# Patient Record
Sex: Male | Born: 2000 | Race: White | Hispanic: No | Marital: Single | Smoking: Former smoker
Health system: Southern US, Community
[De-identification: ages and names within clinical notes are randomized; demographics above are authoritative.]

## PROBLEM LIST (undated history)

## (undated) DIAGNOSIS — R12 Heartburn: Secondary | ICD-10-CM

## (undated) DIAGNOSIS — Z91030 Bee allergy status: Secondary | ICD-10-CM

## (undated) HISTORY — PX: NO PAST SURGERIES: SHX2092

---
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
## 2020-02-10 ENCOUNTER — Other Ambulatory Visit: Payer: Self-pay

## 2020-02-10 ENCOUNTER — Encounter: Payer: Self-pay | Admitting: Emergency Medicine

## 2020-02-10 ENCOUNTER — Ambulatory Visit
Admission: EM | Admit: 2020-02-10 | Discharge: 2020-02-10 | Disposition: A | Discharge status: Home / Self Care | Payer: Medicaid Other | Attending: Internal Medicine | Admitting: Internal Medicine

## 2020-02-10 DIAGNOSIS — T148XXA Other injury of unspecified body region, initial encounter: Secondary | ICD-10-CM | POA: Diagnosis not present

## 2020-02-10 DIAGNOSIS — M62830 Muscle spasm of back: Secondary | ICD-10-CM

## 2020-02-10 MED ORDER — METAXALONE 800 MG PO TABS: 800.0000 mg | ORAL_TABLET | Freq: Three times a day (TID) | ORAL | 0 refills | Status: DC

## 2020-02-10 NOTE — Discharge Instructions (Addendum)
Ice area of pain for 15-20 minutes  3 times a day for 48h, then alternate with heat for the duration time and time Do not take the muscle relaxer if it makes you sleepy, and take it ones you are home  Follow up with your family Dr if it persists, you may need to go see physical therapy.   Take Ibuprofen 600 mg three times a day for 5-7 days for pain and inflammation.

## 2020-02-10 NOTE — ED Provider Notes (Signed)
[facility]    CSN: [id number] Arrival date & time: 02/10/20  1418      History   Chief Complaint Chief Complaint  Patient presents with  . Back Pain    HPI Jeffery [name] is a 19 y.o. male who presents with mid thoracic pain radiating to his shoulder blades which occurred while at work  This am around 10:30 am when he was lifting about 30 lb of wood into a small cart and felt a pulling sensation on this area. He left work early.  He does not want to turn this visit into worker's comp case. He reported to his boss who told him to come to the Dr.'s note for leaving work today. He applied heat when he got home and helped a little til he started moving.  He denies injuring this area before.  This pain is described as sharp Provoked with thoracic movement.   Past Medical History:  Diagnosis Date  . Heart burn     There are no problems to display for this patient.   Past Surgical History:  Procedure Laterality Date  . NO PAST SURGERIES       Home Medications    Prior to Admission medications   Medication Sig Start Date End Date Taking? Authorizing Provider  EPINEPHrine (EPIPEN 2-PAK) 0.3 mg/0.3 mL IJ SOAJ injection Inject 0.3 mLs (0.3 mg total) into the muscle once. 03/28/15  Yes Jolene Provost, MD  metaxalone (SKELAXIN) 800 MG tablet Take 1 tablet (800 mg total) by mouth 3 (three) times daily. Prn muscle spasm 02/10/20   Rodriguez-Southworth, Nettie Elm, PA-C    Family History Family History  Problem Relation Age of Onset  . Thyroid disease Mother     Social History Social History   Tobacco Use  . Smoking status: Former Games developer  . Smokeless tobacco: Never Used  Vaping Use  . Vaping Use: Every day  Substance Use Topics  . Alcohol use: No  . Drug use: No     Allergies   Patient has no known allergies.   Review of Systems Review of Systems  Constitutional: Negative for activity change, appetite change and fever.  Respiratory: Negative for cough,  chest tightness and shortness of breath.   Cardiovascular: Negative for chest pain.  Gastrointestinal: Negative for abdominal pain.  Musculoskeletal: Positive for back pain.  Skin: Negative for rash and wound.  Neurological: Negative for numbness.   Physical Exam Triage Vital Signs ED Triage Vitals  Enc Vitals Group     BP 02/10/20 1438 125/73     Pulse Rate 02/10/20 1438 61     Resp 02/10/20 1438 18     Temp 02/10/20 1438 98.6 F (37 C)     Temp Source 02/10/20 1438 Oral     SpO2 02/10/20 1438 100 %     Weight 02/10/20 1435 132 lb 15 oz (60.3 kg)     Height 02/10/20 1435 6' (1.829 m)     Head Circumference --      Peak Flow --      Pain Score 02/10/20 1435 7     Pain Loc --      Pain Edu? --      Excl. in GC? --    No data found.  Updated Vital Signs BP 125/73 (BP Location: Left Arm)   Pulse 61   Temp 98.6 F (37 C) (Oral)   Resp 18   Ht 6' (1.829 m)   Wt 132 lb 15 oz (60.3 kg)  SpO2 100%   BMI 18.03 kg/m   Visual Acuity Right Eye Distance:   Left Eye Distance:   Bilateral Distance:    Right Eye Near:   Left Eye Near:    Bilateral Near:     Physical Exam Vitals and nursing note reviewed.  Constitutional:      General: He is not in acute distress.    Appearance: He is normal weight. He is not toxic-appearing.  HENT:     Head: Normocephalic.     Right Ear: External ear normal.     Left Ear: External ear normal.  Eyes:     General: No scleral icterus.    Conjunctiva/sclera: Conjunctivae normal.  Cardiovascular:     Rate and Rhythm: Normal rate and regular rhythm.     Heart sounds: No murmur heard.   Pulmonary:     Effort: Pulmonary effort is normal.     Breath sounds: Normal breath sounds.  Musculoskeletal:        General: Normal range of motion.     Cervical back: Neck supple.     Comments: BACK- has local tenderness on L mid thoracic muscle region which has spasms. Thoracic rotation and L lateral and anterior  flexion provoked his pain   Skin:    General: Skin is warm and dry.  Neurological:     Mental Status: He is alert and oriented to person, place, and time.     Motor: No weakness.     Gait: Gait normal.     Deep Tendon Reflexes: Reflexes normal.  Psychiatric:        Mood and Affect: Mood normal.        Behavior: Behavior normal.        Thought Content: Thought content normal.        Judgment: Judgment normal.     UC Treatments / Results  Labs (all labs ordered are listed, but only abnormal results are displayed) Labs Reviewed - No data to display  EKG   Radiology No results found.  Procedures Procedures (including critical care time)  Medications Ordered in UC Medications - No data to display  Initial Impression / Assessment and Plan / UC Course  I have reviewed the triage vital signs and the nursing notes. Has L thoracic strain. I prescribed him Skelaxin as noted.  See instructions.   Final Clinical Impressions(s) / UC Diagnoses   Final diagnoses:  Muscle strain  Muscle spasm of back     Discharge Instructions     Ice area of pain for 15-20 minutes  3 times a day for 48h, then alternate with heat for the duration time and time Do not take the muscle relaxer if it makes you sleepy, and take it ones you are home  Follow up with your family Dr if it persists, you may need to go see physical therapy.   Take Ibuprofen 600 mg three times a day for 5-7 days for pain and inflammation.     ED Prescriptions    Medication Sig Dispense Auth. Provider   metaxalone (SKELAXIN) 800 MG tablet Take 1 tablet (800 mg total) by mouth 3 (three) times daily. Prn muscle spasm 30 tablet Rodriguez-Southworth, Nettie Elm, PA-C     PDMP not reviewed this encounter.   Garey Ham, PA-C 02/10/20 1552

## 2020-02-10 NOTE — ED Triage Notes (Signed)
Pt c/o back pain. He states it is located from the bottom of his rub cage to the shoulder blades. He states he felt it pull while lifting something at work. 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 He states every time he moves the area aches.

## 2020-02-11 ENCOUNTER — Telehealth: Payer: Self-pay | Admitting: Emergency Medicine

## 2020-02-11 MED ORDER — METHOCARBAMOL 500 MG PO TABS: 500.0000 mg | ORAL_TABLET | Freq: Two times a day (BID) | ORAL | 0 refills | Status: DC | PRN

## 2020-02-11 NOTE — Telephone Encounter (Signed)
Patient was seen yesterday in this facility.  Metaxalone was ordered but was denied by insurance.  Metaxalone order DC; as needed methocarbamol ordered.

## 2020-02-13 ENCOUNTER — Other Ambulatory Visit: Payer: Self-pay

## 2020-02-13 ENCOUNTER — Ambulatory Visit
Admission: EM | Admit: 2020-02-13 | Discharge: 2020-02-13 | Disposition: A | Discharge status: Home / Self Care | Payer: Medicaid Other | Attending: Emergency Medicine | Admitting: Emergency Medicine

## 2020-02-13 DIAGNOSIS — M546 Pain in thoracic spine: Secondary | ICD-10-CM | POA: Diagnosis not present

## 2020-02-13 DIAGNOSIS — Z202 Contact with and (suspected) exposure to infections with a predominantly sexual mode of transmission: Secondary | ICD-10-CM

## 2020-02-13 MED ORDER — TIZANIDINE HCL 4 MG PO TABS: 4.0000 mg | ORAL_TABLET | Freq: Four times a day (QID) | ORAL | 0 refills | Status: DC | PRN

## 2020-02-13 MED ORDER — KETOROLAC TROMETHAMINE 60 MG/2ML IM SOLN
60.0000 mg | Freq: Once | INTRAMUSCULAR | Status: AC
  Administered 2020-02-13: 60 mg via INTRAMUSCULAR

## 2020-02-13 MED ORDER — KETOROLAC TROMETHAMINE 10 MG PO TABS: 10.0000 mg | ORAL_TABLET | Freq: Four times a day (QID) | ORAL | 0 refills | Status: DC | PRN

## 2020-02-13 MED ORDER — DOXYCYCLINE HYCLATE 100 MG PO CAPS: 100.0000 mg | ORAL_CAPSULE | Freq: Two times a day (BID) | ORAL | 0 refills | Status: AC

## 2020-02-13 NOTE — ED Provider Notes (Signed)
[facility]    CSN: [id number] Arrival date & time: 02/13/20  1440      History   Chief Complaint Chief Complaint  Patient presents with  . Back Pain  . Exposure to STD    HPI Jeffery Uddin is a 19 y.o. male.   19 yo male here for re-evaluation of back pain and to obtain treatment for chlamydia.      Past Medical History:  Diagnosis Date  . Heart burn     There are no problems to display for this patient.   Past Surgical History:  Procedure Laterality Date  . NO PAST SURGERIES         Home Medications    Prior to Admission medications   Medication Sig Start Date End Date Taking? Authorizing Provider  EPINEPHrine (EPIPEN 2-PAK) 0.3 mg/0.3 mL IJ SOAJ injection Inject 0.3 mLs (0.3 mg total) into the muscle once. 03/28/15  Yes Jolene Provost, MD  methocarbamol (ROBAXIN) 500 MG tablet Take 1 tablet (500 mg total) by mouth 2 (two) times daily as needed for muscle spasms. 02/11/20  Yes Bailey Mech, NP  doxycycline (VIBRAMYCIN) 100 MG capsule Take 1 capsule (100 mg total) by mouth 2 (two) times daily for 7 days. 02/13/20 02/20/20  Becky Augusta, NP  ketorolac (TORADOL) 10 MG tablet Take 1 tablet (10 mg total) by mouth every 6 (six) hours as needed. 02/13/20   Becky Augusta, NP  tiZANidine (ZANAFLEX) 4 MG tablet Take 1 tablet (4 mg total) by mouth every 6 (six) hours as needed for muscle spasms. 02/13/20   Becky Augusta, NP    Family History Family History  Problem Relation Age of Onset  . Thyroid disease Mother     Social History Social History   Tobacco Use  . Smoking status: Former Games developer  . Smokeless tobacco: Never Used  Vaping Use  . Vaping Use: Every day  Substance Use Topics  . Alcohol use: No  . Drug use: No     Allergies   Patient has no known allergies.   Review of Systems Review of Systems  Constitutional: Negative for activity change, appetite change, fatigue and fever.  HENT: Negative for congestion and sore throat.   Eyes:  Negative for discharge and redness.  Respiratory: Negative for cough and shortness of breath.   Cardiovascular: Negative for chest pain.  Gastrointestinal: Negative for abdominal pain and diarrhea.  Genitourinary: Negative for difficulty urinating, discharge, dysuria, genital sores, penile pain, penile swelling, scrotal swelling, testicular pain and urgency.  Musculoskeletal: Negative for arthralgias and myalgias.  Skin: Negative for rash.  Neurological: Negative for headaches.  Hematological: Negative.      Physical Exam Triage Vital Signs ED Triage Vitals  Enc Vitals Group     BP 02/13/20 1513 127/68     Pulse Rate 02/13/20 1513 (!) 103     Resp 02/13/20 1513 18     Temp 02/13/20 1513 98.2 F (36.8 C)     Temp Source 02/13/20 1513 Oral     SpO2 02/13/20 1513 100 %     Weight 02/13/20 1511 132 lb 4.4 oz (60 kg)     Height 02/13/20 1511 6' (1.829 m)     Head Circumference --      Peak Flow --      Pain Score 02/13/20 1511 9     Pain Loc --      Pain Edu? --      Excl. in GC? --    No  data found.  Updated Vital Signs BP 127/68 (BP Location: Right Arm)   Pulse (!) 103   Temp 98.2 F (36.8 C) (Oral)   Resp 18   Ht 6' (1.829 m)   Wt 132 lb 4.4 oz (60 kg)   SpO2 100%   BMI 17.94 kg/m   Visual Acuity Right Eye Distance:   Left Eye Distance:   Bilateral Distance:    Right Eye Near:   Left Eye Near:    Bilateral Near:     Physical Exam Vitals and nursing note reviewed.  Constitutional:      General: He is not in acute distress.    Appearance: Normal appearance.  HENT:     Head: Normocephalic and atraumatic.     Nose: Nose normal.  Eyes:     Extraocular Movements: Extraocular movements intact.     Conjunctiva/sclera: Conjunctivae normal.     Pupils: Pupils are equal, round, and reactive to light.  Cardiovascular:     Rate and Rhythm: Normal rate and regular rhythm.     Pulses: Normal pulses.     Heart sounds: Normal heart sounds.  Pulmonary:     Effort:  Pulmonary effort is normal.     Breath sounds: Normal breath sounds.  Abdominal:     Hernia: There is no hernia in the left inguinal area or right inguinal area.  Genitourinary:    Penis: Circumcised. Discharge present. No erythema, tenderness, swelling or lesions.      Testes: Normal.        Right: Tenderness, swelling, testicular hydrocele or varicocele not present.        Left: Tenderness, swelling, testicular hydrocele or varicocele not present.     Epididymis:     Right: Normal.     Left: Normal.     Comments: There is scant clear urethral discharge from the meatus. When swab collected the discharge had a yellow appearance.  Musculoskeletal:        General: Normal range of motion.     Cervical back: Normal, normal range of motion and neck supple. No swelling, edema, erythema, signs of trauma or bony tenderness. No pain with movement.     Thoracic back: Tenderness present. No edema or signs of trauma. Normal range of motion.     Comments: There is no appreciable spasm on the left under the scapula and along the spine to the bottom of the 10 rib as indicated by patient as to the location of his pain.  Full active ROM elicited from patient without limitation. No ecchymosis or edema noted.   Lymphadenopathy:     Cervical: No cervical adenopathy.     Lower Body: No right inguinal adenopathy. No left inguinal adenopathy.  Skin:    General: Skin is warm and dry.     Capillary Refill: Capillary refill takes less than 2 seconds.  Neurological:     General: No focal deficit present.     Mental Status: He is alert and oriented to person, place, and time.  Psychiatric:        Mood and Affect: Mood normal.        Behavior: Behavior normal.        Thought Content: Thought content normal.        Judgment: Judgment normal.      UC Treatments / Results  Labs (all labs ordered are listed, but only abnormal results are displayed) Labs Reviewed  CHLAMYDIA/NGC RT PCR [facility] ONLY)     EKG  Radiology No results found.  Procedures Procedures (including critical care time)  Medications Ordered in UC Medications  ketorolac (TORADOL) injection 60 mg (60 mg Intramuscular Given 02/13/20 1607)    Initial Impression / Assessment and Plan / UC Course  I have reviewed the triage vital signs and the nursing notes.  Pertinent labs & imaging results that were available during my care of the patient were reviewed by me and considered in my medical decision making (see chart for details).   Patient presents for re-evaluation of thoracic back pain that started at work 3 days ago while carrying wood and turning. He denies feeling or hearing any pops. He denies numbness, tingling, or weakness. He was prescribed Robaxin and has been taking it but it is not helping. He has not been using any NSAID's as he denies being instructed to do so. He did use ice for the first 48 hours and has now switched to heat. Incidentally, his girlfriend just tested positive for chlamydia and he would like treatment. He denies any penile discharge or pain with urination.   Will administer Toradol in clinic and switch muscle relaxer to Tizanidine 4 mg TID PRN. Will also prescribe Toradol PO.  Will collect GC swab and treat with Doxycyline x 7 days.    Final Clinical Impressions(s) / UC Diagnoses   Final diagnoses:  Exposure to STD  Acute left-sided thoracic back pain     Discharge Instructions     STOP taking the Robaxin and switch to the Tizanidine. This medication will make you drowsy so do not take it and drive, drink alcohol, or operate machinery.  Follow the back exercises and injury prevention education given at discharge.   Take the Doxycycline twice daily for 7 days. Take this medication with food and wear sunscreen when outdoors as you will be more prone to sunburn.  Abstain from sexual intercourse until after you and your girlfriend have completed treatment and your symptoms have  resolved.     ED Prescriptions    Medication Sig Dispense Auth. Provider   tiZANidine (ZANAFLEX) 4 MG tablet Take 1 tablet (4 mg total) by mouth every 6 (six) hours as needed for muscle spasms. 30 tablet Becky Augusta, NP   ketorolac (TORADOL) 10 MG tablet Take 1 tablet (10 mg total) by mouth every 6 (six) hours as needed. 20 tablet Becky Augusta, NP   doxycycline (VIBRAMYCIN) 100 MG capsule Take 1 capsule (100 mg total) by mouth 2 (two) times daily for 7 days. 14 capsule Becky Augusta, NP     I have reviewed the PDMP during this encounter.   Becky Augusta, NP 02/13/20 [phone]

## 2020-02-13 NOTE — ED Triage Notes (Signed)
Patient states complains of back pain that started Friday morning, states that he was seen here Friday and Rx'd robaxin, states that this has not been helping.   Patient also states that GF is positive for chlamydia so he would like to be treated for this, denies any symptoms.

## 2020-02-13 NOTE — Discharge Instructions (Signed)
STOP taking the Robaxin and switch to the Tizanidine. This medication will make you drowsy so do not take it and drive, drink alcohol, or operate machinery.  Follow the back exercises and injury prevention education given at discharge.   Take the Doxycycline twice daily for 7 days. Take this medication with food and wear sunscreen when outdoors as you will be more prone to sunburn.  Abstain from sexual intercourse until after you and your girlfriend have completed treatment and your symptoms have resolved.

## 2020-02-17 LAB — MISC LABCORP TEST (SEND OUT): Labcorp test code: 183194

## 2020-04-03 ENCOUNTER — Other Ambulatory Visit: Payer: Self-pay

## 2020-04-03 ENCOUNTER — Ambulatory Visit
Admission: EM | Admit: 2020-04-03 | Discharge: 2020-04-03 | Disposition: A | Discharge status: Home / Self Care | Payer: Medicaid Other | Attending: Emergency Medicine | Admitting: Emergency Medicine

## 2020-04-03 ENCOUNTER — Encounter: Payer: Self-pay | Admitting: Emergency Medicine

## 2020-04-03 DIAGNOSIS — M62830 Muscle spasm of back: Secondary | ICD-10-CM | POA: Diagnosis not present

## 2020-04-03 DIAGNOSIS — M546 Pain in thoracic spine: Secondary | ICD-10-CM | POA: Diagnosis not present

## 2020-04-03 MED ORDER — IBUPROFEN 600 MG PO TABS: 600.0000 mg | ORAL_TABLET | Freq: Four times a day (QID) | ORAL | 0 refills | Status: DC | PRN

## 2020-04-03 MED ORDER — PREDNISONE 10 MG (21) PO TBPK: ORAL_TABLET | ORAL | 0 refills | Status: DC

## 2020-04-03 MED ORDER — TIZANIDINE HCL 4 MG PO TABS: 4.0000 mg | ORAL_TABLET | Freq: Three times a day (TID) | ORAL | 0 refills | Status: DC | PRN

## 2020-04-03 NOTE — ED Triage Notes (Signed)
Pt c/o mid back pain. He was seen about a week ago at [facility] for this and when he went back to work he re injured his back and started feeling a lightening pain. He state the pain radiated into his right shoulder.

## 2020-04-03 NOTE — ED Provider Notes (Signed)
HPI  SUBJECTIVE:  Jeffery [name] is a right-handed 19 y.o. male who presents with intermittent, seconds long sharp, shooting pain going from his right scapula to his spine starting last night.  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  He denies recent heavy lifting or pulling at work.  He denies recent repetitive overhead activity.  No chest pain, cough, wheeze, shortness of breath, fevers, rash, shoulder pain.  No numbness or tingling, grip weakness.  He states that this is different than the back pain that he was seen for in the ED last week.  He has never had symptoms like this before.  He tried Robaxin and ibuprofen 400 mg every 4 hours.  The ibuprofen helps.  Symptoms are worse with twisting, forward extension, abduction.  States he cannot AB duct past 90 degrees due to pain.  Past medical history negative for rotator cuff injury, shoulder injury, pneumothorax, diabetes, hypertension.  [facility]:ENIDP, Jeffery Most, MD   Patient was seen here 9/17 for a similar presentation on the left side. Found to have muscular tenderness left mid thoracic region, thought to have musculoskeletal back pain, sent home with Skelaxin, ibuprofen. Seen in the ER 6 days ago for upper right-sided back pain. Again thought to have a muscle strain. UA was normal. Was sent home with Robaxin, advised gentle massage, heat, lidocaine patches, alternating Tylenol and ibuprofen.  Past Medical History:  Diagnosis Date  . Heart burn     Past Surgical History:  Procedure Laterality Date  . NO PAST SURGERIES      Family History  Problem Relation Age of Onset  . Thyroid disease Mother     Social History   Tobacco Use  . Smoking status: Former Games developer  . Smokeless tobacco: Never Used  Vaping Use  . Vaping Use: Every day  Substance Use Topics  . Alcohol use: No  . Drug use: No    No current facility-administered medications for this encounter.  Current Outpatient  Medications:  .  EPINEPHrine (EPIPEN 2-PAK) 0.3 mg/0.3 mL IJ SOAJ injection, Inject 0.3 mLs (0.3 mg total) into the muscle once., Disp: 2 Device, Rfl: 0 .  ibuprofen (ADVIL) 600 MG tablet, Take 1 tablet (600 mg total) by mouth every 6 (six) hours as needed., Disp: 30 tablet, Rfl: 0 .  tiZANidine (ZANAFLEX) 4 MG tablet, Take 1 tablet (4 mg total) by mouth every 8 (eight) hours as needed for muscle spasms., Disp: 30 tablet, Rfl: 0  No Known Allergies   ROS  As noted in HPI.   Physical Exam  BP 120/79 (BP Location: Left Arm)   Pulse 82   Temp 98.6 F (37 C) (Oral)   Resp 18   Ht 6' (1.829 m)   Wt 60 kg   SpO2 100%   BMI 17.94 kg/m   Constitutional: Well developed, well nourished, no acute distress Eyes:  EOMI, conjunctiva normal bilaterally HENT: Normocephalic, atraumatic,mucus membranes moist Respiratory: Normal inspiratory effort, lungs clear bilaterally Cardiovascular: Normal rate GI: nondistended skin: No rash, skin intact Musculoskeletal: Positive tenderness over rhomboid, over scapula along infraspinatus and teres major.  Positive spasm.  No other tenderness over the entire chest.  No C-spine, T-spine, L-spine tenderness.  Right shoulder: Unable to AB duct past 90 degrees due to pain. Drop test normal, clavicle NT , A/C joint NT, scapula  tender, proximal humerus NT, Motor strength normal, Sensation intact LT over deltoid region, distal NVI with hand on affected side  having intact sensation and strength in the distribution of the median, radial, and ulnar nerve.  no pain with internal rotation,  no pain with external rotation,  positive empty can test. positive liftoff test Neurologic: Alert & oriented x 3, no focal neuro deficits Psychiatric: Speech and behavior appropriate   ED Course   Medications - No data to display  No orders of the defined types were placed in this encounter.   No results found for this or any previous visit (from the past 24 hour(s)). No  results found.  ED Clinical Impression  1. Acute right-sided thoracic back pain   2. Muscle spasm of back      ED Assessment/Plan  Previous records reviewed. As noted in HPI.  Seems to be very musculoskeletal.  Denies trauma or heavy lifting.  Unsure as to where the "pop" came from, but he has pain when I stress the rotator cuff and tenderness over infraspinatus and teres major.  It is worse with movement.  In the absence of trauma, imaging was deferred.  Doubt shingles, pneumothorax.  Will send home with Tylenol/ibuprofen, continue heat, discontinue Robaxin, start Zanaflex, 6-day prednisone taper, follow-up with [facility] in a week.  Discussed MDM, treatment plan, and plan for follow-up with patient. patient agrees with plan.   Meds ordered this encounter  Medications  . ibuprofen (ADVIL) 600 MG tablet    Sig: Take 1 tablet (600 mg total) by mouth every 6 (six) hours as needed.    Dispense:  30 tablet    Refill:  0  . tiZANidine (ZANAFLEX) 4 MG tablet    Sig: Take 1 tablet (4 mg total) by mouth every 8 (eight) hours as needed for muscle spasms.    Dispense:  30 tablet    Refill:  0    *This clinic note was created using Scientist, clinical (histocompatibility and immunogenetics). Therefore, there may be occasional mistakes despite careful proofreading.   ?    Jeffery Gong, MD 04/03/20 [phone]

## 2020-04-03 NOTE — Discharge Instructions (Addendum)
Take 600 mg of ibuprofen combined with 1000 mg of Tylenol together 3-4 times a day as needed.  Continue heat.  Discontinue Robaxin, start Zanaflex and the prednisone.  Follow-up with [facility] in a week if not getting better.

## 2020-05-12 ENCOUNTER — Ambulatory Visit
Admission: EM | Admit: 2020-05-12 | Discharge: 2020-05-12 | Disposition: A | Discharge status: Home / Self Care | Payer: Medicaid Other | Attending: Family Medicine | Admitting: Family Medicine

## 2020-05-12 ENCOUNTER — Encounter: Payer: Self-pay | Admitting: Gynecology

## 2020-05-12 ENCOUNTER — Other Ambulatory Visit: Payer: Self-pay

## 2020-05-12 DIAGNOSIS — K529 Noninfective gastroenteritis and colitis, unspecified: Secondary | ICD-10-CM | POA: Diagnosis not present

## 2020-05-12 MED ORDER — ONDANSETRON 4 MG PO TBDP: 4.0000 mg | ORAL_TABLET | Freq: Three times a day (TID) | ORAL | 0 refills | Status: DC | PRN

## 2020-05-12 NOTE — ED Triage Notes (Signed)
Patient c/o stomach bug. Per patient with vomiting and diarrhea on and off x last night.

## 2020-05-12 NOTE — ED Provider Notes (Signed)
[facility]    CSN: [id number] Arrival date & time: 05/12/20  1058      History   Chief Complaint Chief Complaint  Patient presents with   GI Problem   HPI   19 year old male presents with nausea, vomiting, diarrhea.  Started yesterday evening around 5 PM.  Patient reports nausea, vomiting, diarrhea.  No documented fever.  No significant abdominal pain.  He states that his girlfriend has recently been sick with similar symptoms.  No relieving factors.  No other associated symptoms.  No other complaints.  Past Medical History:  Diagnosis Date   Heart burn    Past Surgical History:  Procedure Laterality Date   NO PAST SURGERIES     Home Medications    Prior to Admission medications   Medication Sig Start Date End Date Taking? Authorizing Provider  EPINEPHrine (EPIPEN 2-PAK) 0.3 mg/0.3 mL IJ SOAJ injection Inject 0.3 mLs (0.3 mg total) into the muscle once. 03/28/15  Yes Jolene Provost, MD  gabapentin (NEURONTIN) 100 MG capsule 100 mg daily as needed. 04/11/20  Yes [provider]  ibuprofen (ADVIL) 600 MG tablet Take 1 tablet (600 mg total) by mouth every 6 (six) hours as needed. 04/03/20  Yes Domenick Gong, MD  ondansetron (ZOFRAN ODT) 4 MG disintegrating tablet Take 1 tablet (4 mg total) by mouth every 8 (eight) hours as needed for nausea or vomiting. 05/12/20   Tommie Sams, DO    Family History Family History  Problem Relation Age of Onset   Thyroid disease Mother     Social History Social History   Tobacco Use   Smoking status: Former Smoker   Smokeless tobacco: Never Used  Building services engineer Use: Every day  Substance Use Topics   Alcohol use: No   Drug use: No     Allergies   Patient has no known allergies.   Review of Systems Review of Systems  Constitutional: Negative for fever.  Gastrointestinal: Positive for diarrhea, nausea and vomiting.   Physical Exam Triage Vital Signs ED Triage Vitals  Enc Vitals  Group     BP 05/12/20 1120 121/81     Pulse Rate 05/12/20 1120 63     Resp 05/12/20 1120 16     Temp 05/12/20 1120 98.5 F (36.9 C)     Temp Source 05/12/20 1120 Oral     SpO2 05/12/20 1120 99 %     Weight 05/12/20 1120 132 lb (59.9 kg)     Height 05/12/20 1120 5\' 11"  (1.803 m)     Head Circumference --      Peak Flow --      Pain Score 05/12/20 1119 0     Pain Loc --      Pain Edu? --      Excl. in GC? --    Updated Vital Signs BP 121/81 (BP Location: Left Arm)    Pulse 63    Temp 98.5 F (36.9 C) (Oral)    Resp 16    Ht 5\' 11"  (1.803 m)    Wt 59.9 kg    SpO2 99%    BMI 18.41 kg/m   Visual Acuity Right Eye Distance:   Left Eye Distance:   Bilateral Distance:    Right Eye Near:   Left Eye Near:    Bilateral Near:     Physical Exam Constitutional:      General: He is not in acute distress.    Appearance: Normal appearance.  HENT:  Head: Normocephalic and atraumatic.  Cardiovascular:     Rate and Rhythm: Normal rate and regular rhythm.     Heart sounds: No murmur heard.   Pulmonary:     Effort: Pulmonary effort is normal.     Breath sounds: Normal breath sounds. No wheezing or rales.  Abdominal:     General: There is no distension.     Palpations: Abdomen is soft.     Tenderness: There is no abdominal tenderness.  Neurological:     Mental Status: He is alert.  Psychiatric:        Mood and Affect: Mood normal.        Behavior: Behavior normal.    UC Treatments / Results  Labs (all labs ordered are listed, but only abnormal results are displayed) Labs Reviewed - No data to display  EKG   Radiology No results found.  Procedures Procedures (including critical care time)  Medications Ordered in UC Medications - No data to display  Initial Impression / Assessment and Plan / UC Course  I have reviewed the triage vital signs and the nursing notes.  Pertinent labs & imaging results that were available during my care of the patient were reviewed by  me and considered in my medical decision making (see chart for details).    19 year old male presents with gastroenteritis.  Zofran as needed.  Work note given.  Supportive care.  Final Clinical Impressions(s) / UC Diagnoses   Final diagnoses:  Gastroenteritis     Discharge Instructions     Lots of fluids.  Medication as prescribed.  Take care  Dr. Adriana Simas    ED Prescriptions    Medication Sig Dispense Auth. Provider   ondansetron (ZOFRAN ODT) 4 MG disintegrating tablet Take 1 tablet (4 mg total) by mouth every 8 (eight) hours as needed for nausea or vomiting. 20 tablet Tommie Sams, DO     PDMP not reviewed this encounter.   Tommie Sams, [place] 05/12/20 [phone]

## 2020-05-12 NOTE — Discharge Instructions (Signed)
Lots of fluids.  Medication as prescribed.  Take care  Dr. Daton Szilagyi  

## 2020-05-17 ENCOUNTER — Other Ambulatory Visit: Payer: Self-pay

## 2020-05-17 ENCOUNTER — Ambulatory Visit
Admission: EM | Admit: 2020-05-17 | Discharge: 2020-05-17 | Disposition: A | Discharge status: Home / Self Care | Payer: Medicaid Other | Attending: Family Medicine | Admitting: Family Medicine

## 2020-05-17 DIAGNOSIS — R197 Diarrhea, unspecified: Secondary | ICD-10-CM

## 2020-05-17 DIAGNOSIS — Z87891 Personal history of nicotine dependence: Secondary | ICD-10-CM | POA: Diagnosis not present

## 2020-05-17 DIAGNOSIS — Z20822 Contact with and (suspected) exposure to covid-19: Secondary | ICD-10-CM | POA: Insufficient documentation

## 2020-05-17 DIAGNOSIS — R112 Nausea with vomiting, unspecified: Secondary | ICD-10-CM

## 2020-05-17 LAB — CBC WITH DIFFERENTIAL/PLATELET
Abs Immature Granulocytes: 0.01 10*3/uL (ref 0.00–0.07)
Basophils Absolute: 0 10*3/uL (ref 0.0–0.1)
Basophils Relative: 1 %
Eosinophils Absolute: 0.1 10*3/uL (ref 0.0–0.5)
Eosinophils Relative: 1 %
HCT: 42.1 % (ref 39.0–52.0)
Hemoglobin: 14.2 g/dL (ref 13.0–17.0)
Immature Granulocytes: 0 %
Lymphocytes Relative: 44 %
Lymphs Abs: 2.7 10*3/uL (ref 0.7–4.0)
MCH: 28.2 pg (ref 26.0–34.0)
MCHC: 33.7 g/dL (ref 30.0–36.0)
MCV: 83.5 fL (ref 80.0–100.0)
Monocytes Absolute: 0.5 10*3/uL (ref 0.1–1.0)
Monocytes Relative: 7 %
Neutro Abs: 2.9 10*3/uL (ref 1.7–7.7)
Neutrophils Relative %: 47 %
Platelets: 242 10*3/uL (ref 150–400)
RBC: 5.04 MIL/uL (ref 4.22–5.81)
RDW: 12.2 % (ref 11.5–15.5)
WBC: 6.1 10*3/uL (ref 4.0–10.5)
nRBC: 0 % (ref 0.0–0.2)

## 2020-05-17 LAB — COMPREHENSIVE METABOLIC PANEL
ALT: 14 U/L (ref 0–44)
AST: 22 U/L (ref 15–41)
Albumin: 5.3 g/dL — ABNORMAL HIGH (ref 3.5–5.0)
Alkaline Phosphatase: 70 U/L (ref 38–126)
Anion gap: 9 (ref 5–15)
BUN: 9 mg/dL (ref 6–20)
CO2: 28 mmol/L (ref 22–32)
Calcium: 9.6 mg/dL (ref 8.9–10.3)
Chloride: 102 mmol/L (ref 98–111)
Creatinine, Ser: 0.89 mg/dL (ref 0.61–1.24)
GFR, Estimated: >60 mL/min (ref >60)
Glucose, Bld: 96 mg/dL (ref 70–99)
Potassium: 4.1 mmol/L (ref 3.5–5.1)
Sodium: 139 mmol/L (ref 135–145)
Total Bilirubin: 0.9 mg/dL (ref 0.3–1.2)
Total Protein: 8.5 g/dL — ABNORMAL HIGH (ref 6.5–8.1)

## 2020-05-17 LAB — LIPASE, BLOOD: Lipase: 24 U/L (ref 11–51)

## 2020-05-17 LAB — RESP PANEL BY RT-PCR (FLU A&B, COVID) ARPGX2
Influenza A by PCR: NEGATIVE
Influenza B by PCR: NEGATIVE
SARS Coronavirus 2 by RT PCR: NEGATIVE

## 2020-05-17 MED ORDER — ONDANSETRON HCL 4 MG PO TABS: 4.0000 mg | ORAL_TABLET | Freq: Three times a day (TID) | ORAL | 0 refills | Status: DC | PRN

## 2020-05-17 MED ORDER — DIPHENOXYLATE-ATROPINE 2.5-0.025 MG PO TABS
1.0000 | ORAL_TABLET | Freq: Four times a day (QID) | ORAL | 0 refills | Status: DC | PRN
Start: 2020-05-17 — End: 2020-10-29

## 2020-05-17 NOTE — Discharge Instructions (Signed)
Meds as prescribed. ° °Take care ° °Dr. Aster Screws  °

## 2020-05-17 NOTE — ED Triage Notes (Signed)
Pt with n/v/d came on suddenly last night around 9 pm.  Ate [employer] chicken around 6 p.m.  Thinks the chicken might have been undercooked.

## 2020-05-17 NOTE — ED Provider Notes (Signed)
[facility]    CSN: [id number] Arrival date & time: 05/17/20  1022      History   Chief Complaint Chief Complaint  Patient presents with  . Nausea  . Diarrhea    HPI  19 year old male presents with the above complaints.  Patient recently seen on 12/18.  Diagnosed with gastroenteritis.  Patient states that he improved and was doing well until last night around 9 PM.  He states that he ate some chicken and later became ill with nausea, vomiting, diarrhea.  This has persisted into today.  He tried to work but was unable to do so.  He denies any respiratory symptoms.  No fever.  No current abdominal pain.  No other associated symptoms.  No other complaints.  Past Medical History:  Diagnosis Date  . Heart burn    Past Surgical History:  Procedure Laterality Date  . NO PAST SURGERIES      Home Medications    Prior to Admission medications   Medication Sig Start Date End Date Taking? Authorizing Provider  diphenoxylate-atropine (LOMOTIL) 2.5-0.025 MG tablet Take 1 tablet by mouth 4 (four) times daily as needed for diarrhea or loose stools. 05/17/20   Tommie Sams, DO  EPINEPHrine (EPIPEN 2-PAK) 0.3 mg/0.3 mL IJ SOAJ injection Inject 0.3 mLs (0.3 mg total) into the muscle once. 03/28/15   Jolene Provost, MD  gabapentin (NEURONTIN) 100 MG capsule 100 mg daily as needed. 04/11/20   [provider]  ondansetron (ZOFRAN) 4 MG tablet Take 1 tablet (4 mg total) by mouth every 8 (eight) hours as needed for nausea or vomiting. 05/17/20   Tommie Sams, DO    Family History Family History  Problem Relation Age of Onset  . Thyroid disease Mother     Social History Social History   Tobacco Use  . Smoking status: Former Games developer  . Smokeless tobacco: Never Used  Vaping Use  . Vaping Use: Every day  Substance Use Topics  . Alcohol use: No  . Drug use: No     Allergies   Bee venom   Review of Systems Review of Systems  Constitutional: Negative for  fever.  Gastrointestinal: Positive for diarrhea, nausea and vomiting.   Physical Exam Triage Vital Signs ED Triage Vitals  Enc Vitals Group     BP 05/17/20 1104 122/83     Pulse Rate 05/17/20 1104 92     Resp 05/17/20 1104 16     Temp 05/17/20 1104 98.5 F (36.9 C)     Temp Source 05/17/20 1104 Oral     SpO2 05/17/20 1104 100 %     Weight 05/17/20 1102 135 lb (61.2 kg)     Height 05/17/20 1102 5\' 11"  (1.803 m)     Head Circumference --      Peak Flow --      Pain Score 05/17/20 1102 0     Pain Loc --      Pain Edu? --      Excl. in GC? --    Updated Vital Signs BP 122/83 (BP Location: Left Arm)   Pulse 92   Temp 98.5 F (36.9 C) (Oral)   Resp 16   Ht 5\' 11"  (1.803 m)   Wt 61.2 kg   SpO2 100%   BMI 18.83 kg/m   Visual Acuity Right Eye Distance:   Left Eye Distance:   Bilateral Distance:    Right Eye Near:   Left Eye Near:    Bilateral  Near:     Physical Exam Constitutional:      General: He is not in acute distress.    Appearance: Normal appearance. He is not ill-appearing.  HENT:     Head: Normocephalic and atraumatic.  Cardiovascular:     Rate and Rhythm: Normal rate and regular rhythm.     Heart sounds: No murmur heard.   Pulmonary:     Effort: Pulmonary effort is normal.     Breath sounds: Normal breath sounds. No wheezing, rhonchi or rales.  Abdominal:     General: There is no distension.     Palpations: Abdomen is soft.     Tenderness: There is no abdominal tenderness.  Neurological:     Mental Status: He is alert.  Psychiatric:        Mood and Affect: Mood normal.        Behavior: Behavior normal.    UC Treatments / Results  Labs (all labs ordered are listed, but only abnormal results are displayed) Labs Reviewed  COMPREHENSIVE METABOLIC PANEL - Abnormal; Notable for the following components:      Result Value   Total Protein 8.5 (*)    Albumin 5.3 (*)    All other components within normal limits  RESP PANEL BY RT-PCR (FLU A&B,  COVID) ARPGX2  CBC WITH DIFFERENTIAL/PLATELET  LIPASE, BLOOD    EKG   Radiology No results found.  Procedures Procedures (including critical care time)  Medications Ordered in UC Medications - No data to display  Initial Impression / Assessment and Plan / UC Course  I have reviewed the triage vital signs and the nursing notes.  Pertinent labs & imaging results that were available during my care of the patient were reviewed by me and considered in my medical decision making (see chart for details).    19 year old male presents with nausea, vomiting, diarrhea.  Well-appearing on exam.  Covid and flu testing negative.  Laboratory studies unremarkable.  Treating symptomatically with Zofran and Lomotil.  Final Clinical Impressions(s) / UC Diagnoses   Final diagnoses:  Nausea vomiting and diarrhea     Discharge Instructions     Meds as prescribed.  Take care  Dr. Adriana Simas    ED Prescriptions    Medication Sig Dispense Auth. Provider   ondansetron (ZOFRAN) 4 MG tablet Take 1 tablet (4 mg total) by mouth every 8 (eight) hours as needed for nausea or vomiting. 20 tablet Geraldo Haris G, DO   diphenoxylate-atropine (LOMOTIL) 2.5-0.025 MG tablet Take 1 tablet by mouth 4 (four) times daily as needed for diarrhea or loose stools. 30 tablet Tommie Sams, DO     PDMP not reviewed this encounter.   Tommie Sams, [place] 05/17/20 1315

## 2020-07-15 ENCOUNTER — Other Ambulatory Visit: Payer: Self-pay

## 2020-07-15 ENCOUNTER — Ambulatory Visit
Admission: EM | Admit: 2020-07-15 | Discharge: 2020-07-15 | Disposition: A | Discharge status: Home / Self Care | Payer: Medicaid Other | Attending: Family Medicine | Admitting: Family Medicine

## 2020-07-15 ENCOUNTER — Encounter: Payer: Self-pay | Admitting: Emergency Medicine

## 2020-07-15 DIAGNOSIS — R051 Acute cough: Secondary | ICD-10-CM | POA: Insufficient documentation

## 2020-07-15 DIAGNOSIS — Z79899 Other long term (current) drug therapy: Secondary | ICD-10-CM | POA: Diagnosis not present

## 2020-07-15 DIAGNOSIS — R059 Cough, unspecified: Secondary | ICD-10-CM | POA: Diagnosis not present

## 2020-07-15 DIAGNOSIS — Z20822 Contact with and (suspected) exposure to covid-19: Secondary | ICD-10-CM | POA: Insufficient documentation

## 2020-07-15 DIAGNOSIS — Z87891 Personal history of nicotine dependence: Secondary | ICD-10-CM | POA: Diagnosis not present

## 2020-07-15 LAB — SARS CORONAVIRUS 2 (TAT 6-24 HRS): SARS Coronavirus 2: NEGATIVE

## 2020-07-15 NOTE — Discharge Instructions (Signed)

## 2020-07-15 NOTE — ED Provider Notes (Signed)
[facility]    CSN: [id number] Arrival date & time: 07/15/20  1301      History   Chief Complaint Chief Complaint  Patient presents with  . Cough    HPI Jeffery [name] is a 20 y.o. male presenting for cough and mild headache x3 days.  Patient states that he coughed at work and was advised to have a Covid test before returning.  He says that he believes his cough is due to "hitting my vape."  Patient denies any fever, fatigue, body aches, congestion, sore throat, chest pain, breathing difficulty, abdominal pain, nausea/vomiting or diarrhea.  Not taking any medication for cough.  No known Covid exposure.  Has received [employer] Covid vaccine but no distress.  Otherwise healthy with no history of cardiopulmonary disease.  No other complaints or concerns today.  HPI  Past Medical History:  Diagnosis Date  . Heart burn     There are no problems to display for this patient.   Past Surgical History:  Procedure Laterality Date  . NO PAST SURGERIES         Home Medications    Prior to Admission medications   Medication Sig Start Date End Date Taking? Authorizing Provider  EPINEPHrine (EPIPEN 2-PAK) 0.3 mg/0.3 mL IJ SOAJ injection Inject 0.3 mLs (0.3 mg total) into the muscle once. 03/28/15  Yes Jolene Provost, MD  diphenoxylate-atropine (LOMOTIL) 2.5-0.025 MG tablet Take 1 tablet by mouth 4 (four) times daily as needed for diarrhea or loose stools. 05/17/20   Tommie Sams, DO  gabapentin (NEURONTIN) 100 MG capsule 100 mg daily as needed. 04/11/20   [provider]  ondansetron (ZOFRAN) 4 MG tablet Take 1 tablet (4 mg total) by mouth every 8 (eight) hours as needed for nausea or vomiting. 05/17/20   Tommie Sams, DO    Family History Family History  Problem Relation Age of Onset  . Thyroid disease Mother   . Healthy Father     Social History Social History   Tobacco Use  . Smoking status: Former Smoker    Types: Cigarettes    Quit date:  03/14/2020    Years since quitting: 0.3  . Smokeless tobacco: Never Used  Vaping Use  . Vaping Use: Every day  . Substances: Nicotine, Flavoring  Substance Use Topics  . Alcohol use: No  . Drug use: No     Allergies   Bee venom   Review of Systems Review of Systems  Constitutional: Negative for fatigue and fever.  HENT: Negative for congestion, rhinorrhea, sinus pressure, sinus pain and sore throat.   Respiratory: Positive for cough. Negative for shortness of breath.   Gastrointestinal: Negative for abdominal pain, diarrhea, nausea and vomiting.  Musculoskeletal: Negative for myalgias.  Neurological: Positive for headaches. Negative for weakness and light-headedness.  Hematological: Negative for adenopathy.     Physical Exam Triage Vital Signs ED Triage Vitals [07/15/20 1310]  Enc Vitals Group     BP 124/75     Pulse Rate 77     Resp 18     Temp 98.1 F (36.7 C)     Temp Source Oral     SpO2 100 %     Weight 130 lb (59 kg)     Height 5\' 11"  (1.803 m)     Head Circumference      Peak Flow      Pain Score 0     Pain Loc      Pain Edu?  Excl. in GC?    No data found.  Updated Vital Signs BP 124/75 (BP Location: Left Arm)   Pulse 77   Temp 98.1 F (36.7 C) (Oral)   Resp 18   Ht 5\' 11"  (1.803 m)   Wt 130 lb (59 kg)   SpO2 100%   BMI 18.13 kg/m       Physical Exam Vitals and nursing note reviewed.  Constitutional:      General: He is not in acute distress.    Appearance: Normal appearance. He is well-developed and well-nourished. He is not ill-appearing.  HENT:     Head: Normocephalic and atraumatic.     Nose: Nose normal.     Mouth/Throat:     Mouth: Mucous membranes are moist.     Pharynx: Oropharynx is clear.  Eyes:     General: No scleral icterus.    Conjunctiva/sclera: Conjunctivae normal.  Cardiovascular:     Rate and Rhythm: Normal rate and regular rhythm.     Heart sounds: Normal heart sounds.  Pulmonary:     Effort: Pulmonary  effort is normal. No respiratory distress.     Breath sounds: Normal breath sounds.  Musculoskeletal:        General: No edema.     Cervical back: Neck supple.  Skin:    General: Skin is dry.  Neurological:     General: No focal deficit present.     Mental Status: He is alert. Mental status is at baseline.     Motor: No weakness.     Gait: Gait normal.  Psychiatric:        Mood and Affect: Mood and affect and mood normal.        Behavior: Behavior normal.        Thought Content: Thought content normal.      UC Treatments / Results  Labs (all labs ordered are listed, but only abnormal results are displayed) Labs Reviewed  SARS CORONAVIRUS 2 (TAT 6-24 HRS)    EKG   Radiology No results found.  Procedures Procedures (including critical care time)  Medications Ordered in UC Medications - No data to display  Initial Impression / Assessment and Plan / UC Course  I have reviewed the triage vital signs and the nursing notes.  Pertinent labs & imaging results that were available during my care of the patient were reviewed by me and considered in my medical decision making (see chart for details).   20 year old male with 3-day history of cough and headache.  All vital signs are normal and stable in clinic and exam is benign.  Send out Covid testing performed.  Current CDC guidelines, isolation protocol and ED precautions reviewed with patient.  Supportive care advised.  Work note provided.   Final Clinical Impressions(s) / UC Diagnoses   Final diagnoses:  Cough     Discharge Instructions     You have received COVID testing today either for positive exposure, concerning symptoms that could be related to COVID infection, screening purposes, or re-testing after confirmed positive.  Your test obtained today checks for active viral infection in the last 1-2 weeks. If your test is negative now, you can still test positive later. So, if you do develop symptoms you should  either get re-tested and/or isolate x 5 days and then strict mask use x 5 days (unvaccinated) or mask use x 10 days (vaccinated). Please follow CDC guidelines.  While Rapid antigen tests come back in 15-20 minutes, send out PCR/molecular test  results typically come back within 1-3 days. In the mean time, if you are symptomatic, assume this could be a positive test and treat/monitor yourself as if you do have COVID.   We will call with test results if positive. Please download the MyChart app and set up a profile to access test results.   If symptomatic, go home and rest. Push fluids. Take Tylenol as needed for discomfort. Gargle warm salt water. Throat lozenges. Take Mucinex DM or Robitussin for cough. Humidifier in bedroom to ease coughing. Warm showers. Also review the COVID handout for more information.  COVID-19 INFECTION: The incubation period of COVID-19 is approximately 14 days after exposure, with most symptoms developing in roughly 4-5 days. Symptoms may range in severity from mild to critically severe. Roughly 80% of those infected will have mild symptoms. People of any age may become infected with COVID-19 and have the ability to transmit the virus. The most common symptoms include: fever, fatigue, cough, body aches, headaches, sore throat, nasal congestion, shortness of breath, nausea, vomiting, diarrhea, changes in smell and/or taste.    COURSE OF ILLNESS Some patients may begin with mild disease which can progress quickly into critical symptoms. If your symptoms are worsening please call ahead to the Emergency Department and proceed there for further treatment. Recovery time appears to be roughly 1-2 weeks for mild symptoms and 3-6 weeks for severe disease.   GO IMMEDIATELY TO ER FOR FEVER YOU ARE UNABLE TO GET DOWN WITH TYLENOL, BREATHING PROBLEMS, CHEST PAIN, FATIGUE, LETHARGY, INABILITY TO EAT OR DRINK, ETC  QUARANTINE AND ISOLATION: To help decrease the spread of COVID-19 please  remain isolated if you have COVID infection or are highly suspected to have COVID infection. This means -stay home and isolate to one room in the home if you live with others. Do not share a bed or bathroom with others while ill, sanitize and wipe down all countertops and keep common areas clean and disinfected. Stay home for 5 days. If you have no symptoms or your symptoms are resolving after 5 days, you can leave your house. Continue to wear a mask around others for 5 additional days. If you have been in close contact (within 6 feet) of someone diagnosed with COVID 19, you are advised to quarantine in your home for 14 days as symptoms can develop anywhere from 2-14 days after exposure to the virus. If you develop symptoms, you  must isolate.  Most current guidelines for COVID after exposure -unvaccinated: isolate 5 days and strict mask use x 5 days. Test on day 5 is possible -vaccinated: wear mask x 10 days if symptoms do not develop -You do not necessarily need to be tested for COVID if you have + exposure and  develop symptoms. Just isolate at home x10 days from symptom onset During this global pandemic, CDC advises to practice social distancing, try to stay at least 246ft away from others at all times. Wear a face covering. Wash and sanitize your hands regularly and avoid going anywhere that is not necessary.  KEEP IN MIND THAT THE COVID TEST IS NOT 100% ACCURATE AND YOU SHOULD STILL DO EVERYTHING TO PREVENT POTENTIAL SPREAD OF VIRUS TO OTHERS (WEAR MASK, WEAR GLOVES, WASH HANDS AND SANITIZE REGULARLY). IF INITIAL TEST IS NEGATIVE, THIS MAY NOT MEAN YOU ARE DEFINITELY NEGATIVE. MOST ACCURATE TESTING IS DONE 5-7 DAYS AFTER EXPOSURE.   It is not advised by CDC to get re-tested after receiving a positive COVID test since you can still  test positive for weeks to months after you have already cleared the virus.   *If you have not been vaccinated for COVID, I strongly suggest you consider getting  vaccinated as long as there are no contraindications.      ED Prescriptions    None     PDMP not reviewed this encounter.   Shirlee Latch, PA-C 07/15/20 1341

## 2020-07-15 NOTE — ED Triage Notes (Addendum)
Patient in today c/o cough that only happens when "I hit my vape" x 2 days. Patient has not taken any OTC medications. Patient requesting a covid test for his work. Patient has had the J&J covid vaccine, no booster.

## 2020-09-03 ENCOUNTER — Other Ambulatory Visit: Payer: Self-pay

## 2020-09-03 ENCOUNTER — Encounter: Payer: Self-pay | Admitting: *Deleted

## 2020-09-03 ENCOUNTER — Emergency Department: Payer: Medicaid Other

## 2020-09-03 ENCOUNTER — Emergency Department
Admission: EM | Admit: 2020-09-03 | Discharge: 2020-09-03 | Disposition: A | Discharge status: Home / Self Care | Payer: Medicaid Other | Attending: Emergency Medicine | Admitting: Emergency Medicine

## 2020-09-03 DIAGNOSIS — S9032XA Contusion of left foot, initial encounter: Secondary | ICD-10-CM | POA: Insufficient documentation

## 2020-09-03 DIAGNOSIS — Z87891 Personal history of nicotine dependence: Secondary | ICD-10-CM | POA: Diagnosis not present

## 2020-09-03 DIAGNOSIS — W240XXA Contact with lifting devices, not elsewhere classified, initial encounter: Secondary | ICD-10-CM | POA: Insufficient documentation

## 2020-09-03 DIAGNOSIS — Y990 Civilian activity done for income or pay: Secondary | ICD-10-CM | POA: Diagnosis not present

## 2020-09-03 DIAGNOSIS — S99922A Unspecified injury of left foot, initial encounter: Secondary | ICD-10-CM | POA: Diagnosis present

## 2020-09-03 MED ORDER — MELOXICAM 15 MG PO TABS: 15.0000 mg | ORAL_TABLET | Freq: Every day | ORAL | 2 refills | Status: DC

## 2020-09-03 NOTE — ED Provider Notes (Signed)
[facility]  ____________________________________________  Time seen: Approximately 9:58 PM  I have reviewed the triage vital signs and the nursing notes.   HISTORY  Chief Complaint Foot Injury   Historian Patient     HPI Jeffery [name] is a 20 y.o. male presents to the emergency department with acute left great toe and left second digit pain after patient rolled a pallet jack over his foot at work.  Patient states that he has had pain with ambulation.  He does have abrasions on the left first and second toes but no lacerations.  Patient denies similar injuries in the past.  Tetanus status is up-to-date.   Past Medical History:  Diagnosis Date  . Heart burn      Immunizations up to date:  Yes.     Past Medical History:  Diagnosis Date  . Heart burn     There are no problems to display for this patient.   Past Surgical History:  Procedure Laterality Date  . NO PAST SURGERIES      Prior to Admission medications   Medication Sig Start Date End Date Taking? Authorizing Provider  meloxicam (MOBIC) 15 MG tablet Take 1 tablet (15 mg total) by mouth daily. 09/03/20 09/03/21 Yes Pia Mau M, PA-C  diphenoxylate-atropine (LOMOTIL) 2.5-0.025 MG tablet Take 1 tablet by mouth 4 (four) times daily as needed for diarrhea or loose stools. 05/17/20   Tommie Sams, DO  EPINEPHrine (EPIPEN 2-PAK) 0.3 mg/0.3 mL IJ SOAJ injection Inject 0.3 mLs (0.3 mg total) into the muscle once. 03/28/15   Jolene Provost, MD  gabapentin (NEURONTIN) 100 MG capsule 100 mg daily as needed. 04/11/20   [provider]  ondansetron (ZOFRAN) 4 MG tablet Take 1 tablet (4 mg total) by mouth every 8 (eight) hours as needed for nausea or vomiting. 05/17/20   Tommie Sams, DO    Allergies Bee venom  Family History  Problem Relation Age of Onset  . Thyroid disease Mother   . Healthy Father     Social History Social History   Tobacco Use  . Smoking status: Former Smoker     Types: Cigarettes    Quit date: 03/14/2020    Years since quitting: 0.4  . Smokeless tobacco: Never Used  Vaping Use  . Vaping Use: Every day  . Substances: Nicotine, Flavoring  Substance Use Topics  . Alcohol use: No  . Drug use: No     Review of Systems  Constitutional: No fever/chills Eyes:  No discharge ENT: No upper respiratory complaints. Respiratory: no cough. No SOB/ use of accessory muscles to breath Gastrointestinal:   No nausea, no vomiting.  No diarrhea.  No constipation. Musculoskeletal:Patient has left foot pain.  Skin: Negative for rash, abrasions, lacerations, ecchymosis.    ____________________________________________   PHYSICAL EXAM:  VITAL SIGNS: ED Triage Vitals  Enc Vitals Group     BP 09/03/20 2114 129/76     Pulse Rate 09/03/20 2114 72     Resp 09/03/20 2114 18     Temp 09/03/20 2114 97.9 F (36.6 C)     Temp Source 09/03/20 2114 Oral     SpO2 09/03/20 2114 99 %     Weight 09/03/20 2112 140 lb (63.5 kg)     Height 09/03/20 2112 5\' 11"  (1.803 m)     Head Circumference --      Peak Flow --      Pain Score 09/03/20 2112 8     Pain Loc --  Pain Edu? --      Excl. in GC? --      Constitutional: Alert and oriented. Well appearing and in no acute distress. Eyes: Conjunctivae are normal. PERRL. EOMI. Head: Atraumatic. ENT:      Nose: No congestion/rhinnorhea.      Mouth/Throat: Mucous membranes are moist.  Neck: No stridor.  No cervical spine tenderness to palpation. Cardiovascular: Normal rate, regular rhythm. Normal S1 and S2.  Good peripheral circulation. Respiratory: Normal respiratory effort without tachypnea or retractions. Lungs CTAB. Good air entry to the bases with no decreased or absent breath sounds Gastrointestinal: Bowel sounds x 4 quadrants. Soft and nontender to palpation. No guarding or rigidity. No distention. Musculoskeletal: Full range of motion to all extremities. No obvious deformities noted.  Palpable dorsalis  pedis pulse, left.  Capillary refill less than 2 seconds on the left. Neurologic:  Normal for age. No gross focal neurologic deficits are appreciated.  Skin:  Skin is warm, dry and intact. No rash noted. Psychiatric: Mood and affect are normal for age. Speech and behavior are normal.   ____________________________________________   LABS (all labs ordered are listed, but only abnormal results are displayed)  Labs Reviewed - No data to display ____________________________________________  EKG   ____________________________________________  RADIOLOGY Geraldo Pitter, personally viewed and evaluated these images (plain radiographs) as part of my medical decision making, as well as reviewing the written report by the radiologist.  DG Foot Complete Left  Result Date: 09/03/2020 CLINICAL DATA:  Left foot pain after a pallet Jack rolled over it at work today. EXAM: LEFT FOOT - COMPLETE 3+ VIEW COMPARISON:  Left foot x-rays dated July 09, 2015. FINDINGS: There is no evidence of fracture or dislocation. There is no evidence of arthropathy or other focal bone abnormality. Soft tissues are unremarkable. IMPRESSION: Negative. Electronically Signed   By: Obie Dredge M.D.   On: 09/03/2020 21:26    ____________________________________________    PROCEDURES  Procedure(s) performed:     Procedures     Medications - No data to display   ____________________________________________   INITIAL IMPRESSION / ASSESSMENT AND PLAN / ED COURSE  Pertinent labs & imaging results that were available during my care of the patient were reviewed by me and considered in my medical decision making (see chart for details).      Assessment and plan Left foot pain 20 year old male presents to the emergency department with acute left foot pain after he ran over his foot with a pallet jack at work.  There was no bony abnormality on x-ray.  Patient felt comfortable wearing his work shoes at  discharge.  Recommended meloxicam for pain and inflammation.  All patient questions were answered.    ____________________________________________  FINAL CLINICAL IMPRESSION(S) / ED DIAGNOSES  Final diagnoses:  Contusion of left foot, initial encounter      NEW MEDICATIONS STARTED DURING THIS VISIT:  ED Discharge Orders         Ordered    meloxicam (MOBIC) 15 MG tablet  Daily        09/03/20 2157              This chart was dictated using voice recognition software/Dragon. Despite best efforts to proofread, errors can occur which can change the meaning. Any change was purely unintentional.     Orvil Feil, PA-C 09/03/20 2200    Chesley Noon, MD 09/04/20 1704

## 2020-09-03 NOTE — Discharge Instructions (Signed)
Take Meloxicam once daily for pain and inflammation.  

## 2020-09-03 NOTE — ED Triage Notes (Signed)
Pt states a pallet jack rolled over left foot today at work  NO WC per pt.  brusing noted to left great toe pt alert.

## 2020-10-29 ENCOUNTER — Ambulatory Visit
Admission: EM | Admit: 2020-10-29 | Discharge: 2020-10-29 | Disposition: A | Discharge status: Home / Self Care | Payer: Medicaid Other | Attending: Physician Assistant | Admitting: Physician Assistant

## 2020-10-29 ENCOUNTER — Other Ambulatory Visit: Payer: Self-pay

## 2020-10-29 ENCOUNTER — Encounter: Payer: Self-pay | Admitting: Emergency Medicine

## 2020-10-29 DIAGNOSIS — R112 Nausea with vomiting, unspecified: Secondary | ICD-10-CM | POA: Insufficient documentation

## 2020-10-29 DIAGNOSIS — F411 Generalized anxiety disorder: Secondary | ICD-10-CM | POA: Diagnosis not present

## 2020-10-29 DIAGNOSIS — Z20822 Contact with and (suspected) exposure to covid-19: Secondary | ICD-10-CM | POA: Insufficient documentation

## 2020-10-29 LAB — INFLUENZA A AND B ANTIGEN (CONVERTED LAB)
INFLUENZA A ANTIGEN, POC: NEGATIVE
INFLUENZA B ANTIGEN, POC: NEGATIVE

## 2020-10-29 MED ORDER — HYDROXYZINE HCL 25 MG PO TABS: ORAL_TABLET | ORAL | 0 refills | Status: DC

## 2020-10-29 MED ORDER — ONDANSETRON 4 MG PO TBDP: 4.0000 mg | ORAL_TABLET | Freq: Three times a day (TID) | ORAL | 0 refills | Status: DC | PRN

## 2020-10-29 NOTE — ED Triage Notes (Signed)
Pt c/o headache, body aches, chills and fever. Started this afternoon. He states his temperature was 100.3. he states he took tylenol.

## 2020-10-29 NOTE — ED Provider Notes (Signed)
[facility]    CSN: [id number] Arrival date & time: 10/29/20  1600      History   Chief Complaint Chief Complaint  Patient presents with  . Fever  . Generalized Body Aches    HPI Jeffery [name] is a 20 y.o. male who presents with complaint of HA, body aches, chills and fever up to 100.3 since this pm. Had Tylenol. This am had HA around 10 am, and off and on had abdominal pain. Then at 2 pm after napping woke up chilling. Had a few episodes of heaving, diarrhea x 3 this am. Does not have appetite. Had not had covid infection, and only J&J injection.   2- In the past 3 weeks has been having anxiety, panic attacks and episodes of crying with no reason and this is new for him. Would like a referral. Denies suicidal thoughts    Past Medical History:  Diagnosis Date  . Heart burn     There are no problems to display for this patient.   Past Surgical History:  Procedure Laterality Date  . NO PAST SURGERIES      Home Medications    Prior to Admission medications   Medication Sig Start Date End Date Taking? Authorizing Provider  hydrOXYzine (ATARAX/VISTARIL) 25 MG tablet 1 q 6h prn anxiety 10/29/20  Yes Rodriguez-Southworth, Nettie Elm, PA-C  ondansetron (ZOFRAN-ODT) 4 MG disintegrating tablet Take 1 tablet (4 mg total) by mouth every 8 (eight) hours as needed for nausea or vomiting. 10/29/20  Yes Rodriguez-Southworth, Nettie Elm, PA-C  gabapentin (NEURONTIN) 100 MG capsule 100 mg daily as needed. 04/11/20 10/29/20  [provider]    Family History Family History  Problem Relation Age of Onset  . Thyroid disease Mother   . Healthy Father     Social History Social History   Tobacco Use  . Smoking status: Former Smoker    Types: Cigarettes    Quit date: 03/14/2020    Years since quitting: 0.6  . Smokeless tobacco: Never Used  Vaping Use  . Vaping Use: Every day  . Substances: Nicotine, Flavoring  Substance Use Topics  . Alcohol use: No  . Drug use: No      Allergies   Bee venom   Review of Systems Review of Systems  Constitutional: Positive for appetite change, chills, fatigue and fever.  HENT: Negative for congestion, postnasal drip, rhinorrhea, sore throat and trouble swallowing.   Eyes: Negative for discharge.  Respiratory: Negative for cough and chest tightness.   Gastrointestinal: Positive for nausea and vomiting.  Musculoskeletal: Positive for myalgias. Negative for gait problem.  Skin: Negative for rash.  Neurological: Positive for headaches.  Hematological: Negative for adenopathy.  Psychiatric/Behavioral: Positive for decreased concentration. The patient is nervous/anxious.        Has been having  panic      Physical Exam Triage Vital Signs ED Triage Vitals  Enc Vitals Group     BP 10/29/20 1735 134/83     Pulse Rate 10/29/20 1735 77     Resp 10/29/20 1735 18     Temp 10/29/20 1735 98.2 F (36.8 C)     Temp Source 10/29/20 1735 Oral     SpO2 10/29/20 1735 100 %     Weight 10/29/20 1736 139 lb 15.9 oz (63.5 kg)     Height 10/29/20 1736 5\' 11"  (1.803 m)     Head Circumference --      Peak Flow --      Pain Score 10/29/20  1736 0     Pain Loc --      Pain Edu? --      Excl. in GC? --    No data found.  Updated Vital Signs BP 134/83 (BP Location: Left Arm)   Pulse 77   Temp 98.2 F (36.8 C) (Oral)   Resp 18   Ht 5\' 11"  (1.803 m)   Wt 139 lb 15.9 oz (63.5 kg)   SpO2 100%   BMI 19.52 kg/m   Visual Acuity Right Eye Distance:   Left Eye Distance:   Bilateral Distance:    Right Eye Near:   Left Eye Near:    Bilateral Near:     Physical Exam Physical Exam Vitals signs and nursing note reviewed.  Constitutional:      General: She is not in acute distress.    Appearance: Normal appearance. She is not ill-appearing, toxic-appearing or diaphoretic.  HENT:     Head: Normocephalic.     Right Ear: Tympanic membrane, ear canal and external ear normal.     Left Ear: Tympanic membrane, ear canal and  external ear normal.     Nose: Nose normal.     Mouth/Throat: clear    Mouth: Mucous membranes are moist.  Eyes:     General: No scleral icterus.       Right eye: No discharge.        Left eye: No discharge.     Conjunctiva/sclera: Conjunctivae normal.  Neck:     Musculoskeletal: Neck supple. No neck rigidity.  Cardiovascular:     Rate and Rhythm: Normal rate and regular rhythm.     Heart sounds: No murmur.  Pulmonary:     Effort: Pulmonary effort is normal.     Breath sounds: Normal breath sounds.  Abdominal:     General: Bowel sounds are normal. There is no distension.     Palpations: Abdomen is soft. There is no mass.     Tenderness: There is mild LLQ abdominal tenderness. There is no guarding or rebound.     Hernia: No hernia is present.  Musculoskeletal: Normal range of motion.  Lymphadenopathy:     Cervical: No cervical adenopathy.  Skin:    General: Skin is warm and dry.     Coloration: Skin is not jaundiced.     Findings: No rash.  Neurological:     Mental Status: She is alert and oriented to person, place, and time.     Gait: Gait normal.  Psychiatric:        Mood and Affect: Mood normal.        Behavior: Behavior normal.        Thought Content: Thought content normal.        Judgment: Judgment normal.     UC Treatments / Results  Labs (all labs ordered are listed, but only abnormal results are displayed) Labs Reviewed  SARS CORONAVIRUS 2 (TAT 6-24 HRS)  INFLUENZA A AND B ANTIGEN (CONVERTED LAB)  POC INFLUENZA A AND B ANTIGEN (URGENT CARE ONLY)   Flu tests are negative  EKG   Radiology No results found.  Procedures Procedures (including critical care time)  Medications Ordered in UC Medications - No data to display  Initial Impression / Assessment and Plan / UC Course  I have reviewed the triage vital signs and the nursing notes. Covid test is pending Has viral illness and anxiety. I placed him on Hydroxizine for anxiety and sent Zofran for  nausea as noted. I  found him [facility] psychiatrist to see which he says is as close as [facility]. See instructions  Final Clinical Impressions(s) / UC Diagnoses   Final diagnoses:  Non-intractable vomiting with nausea, unspecified vomiting type  Suspected COVID-19 virus infection     Discharge Instructions     Call the [facility] psychiatry to see them for your anxiety Take the Hydroxyzine to help you with anxiety when you need it. The Zofran is to help you with nausea, take it when you needed.  Stay quarantined until your results are back.     ED Prescriptions    Medication Sig Dispense Auth. Provider   hydrOXYzine (ATARAX/VISTARIL) 25 MG tablet 1 q 6h prn anxiety 30 tablet Rodriguez-Southworth, Tationa Stech, PA-C   ondansetron (ZOFRAN-ODT) 4 MG disintegrating tablet Take 1 tablet (4 mg total) by mouth every 8 (eight) hours as needed for nausea or vomiting. 20 tablet Rodriguez-Southworth, Nettie Elm, PA-C     PDMP not reviewed this encounter.   Garey Ham, [place] 10/29/20 1938

## 2020-10-29 NOTE — Discharge Instructions (Addendum)
Call the [facility] psychiatry to see them for your anxiety Take the Hydroxyzine to help you with anxiety when you need it. The Zofran is to help you with nausea, take it when you needed.  Stay quarantined until your results are back.

## 2020-10-30 LAB — SARS CORONAVIRUS 2 (TAT 6-24 HRS): SARS Coronavirus 2: NEGATIVE

## 2020-11-10 ENCOUNTER — Telehealth: Payer: Self-pay | Admitting: Family Medicine

## 2020-11-10 MED ORDER — HYDROXYZINE HCL 25 MG PO TABS: 25.0000 mg | ORAL_TABLET | Freq: Three times a day (TID) | ORAL | 1 refills | Status: DC | PRN

## 2020-11-10 NOTE — Telephone Encounter (Signed)
Patient requesting refill on Atarax as he will not have enough to make it to his appointment.  Rx sent  Everlene Other DO [facility]

## 2021-05-24 IMAGING — DX DG FOOT COMPLETE 3+V*L*
3 series · 3 of 3 positions shown · non-contrast
Comparison: Left foot x-rays dated July 09, 2015.

CLINICAL DATA: Left foot pain after a pallet Apdhan rolled over it at
work today.

EXAM:
LEFT FOOT - COMPLETE 3+ VIEW

[foot ap]
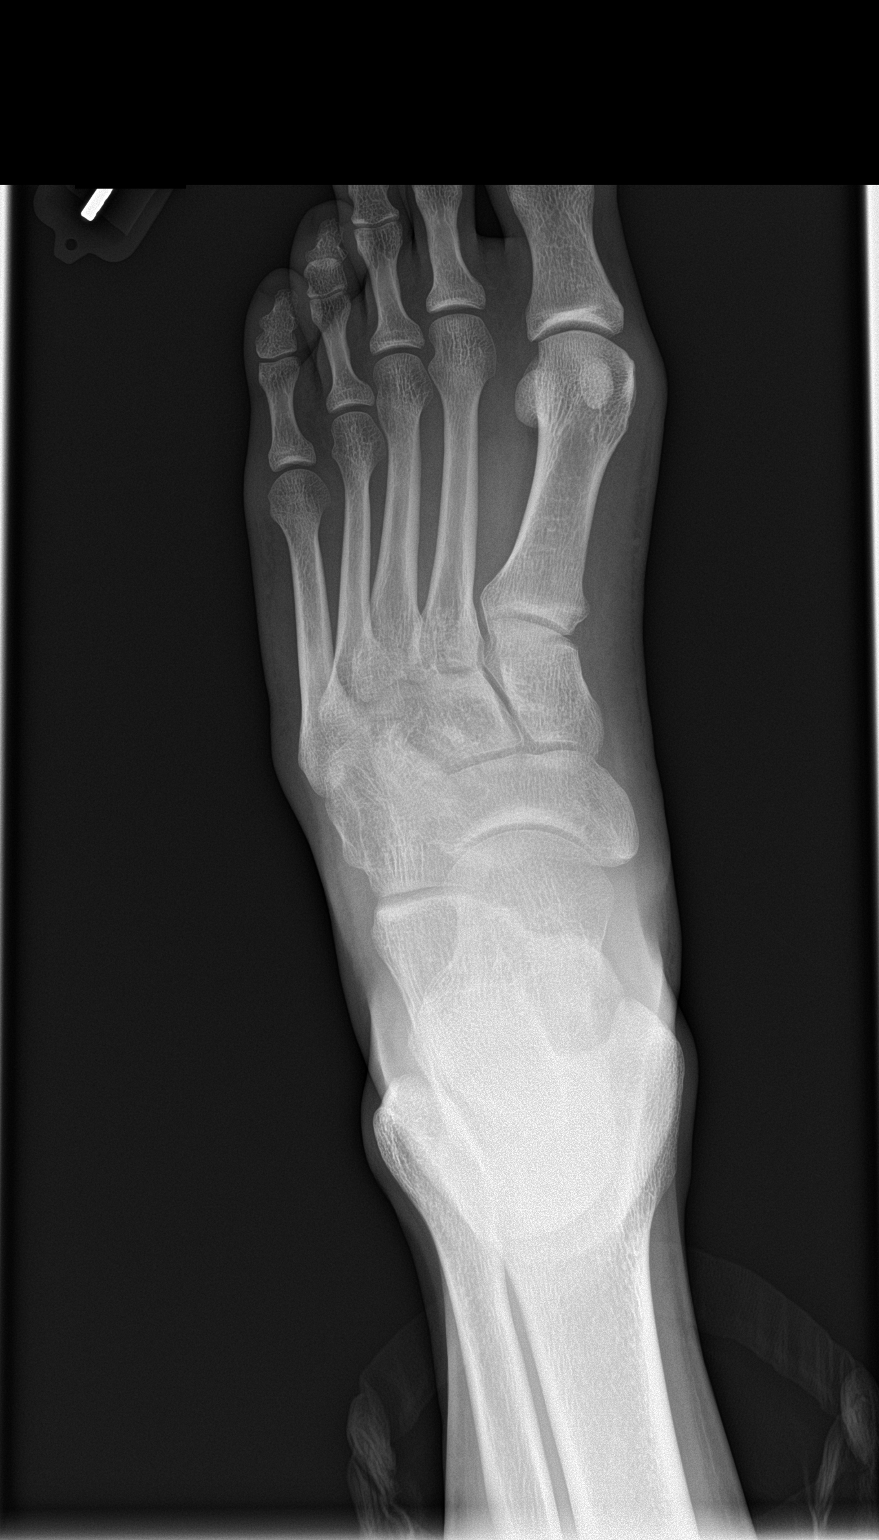

[foot obl]
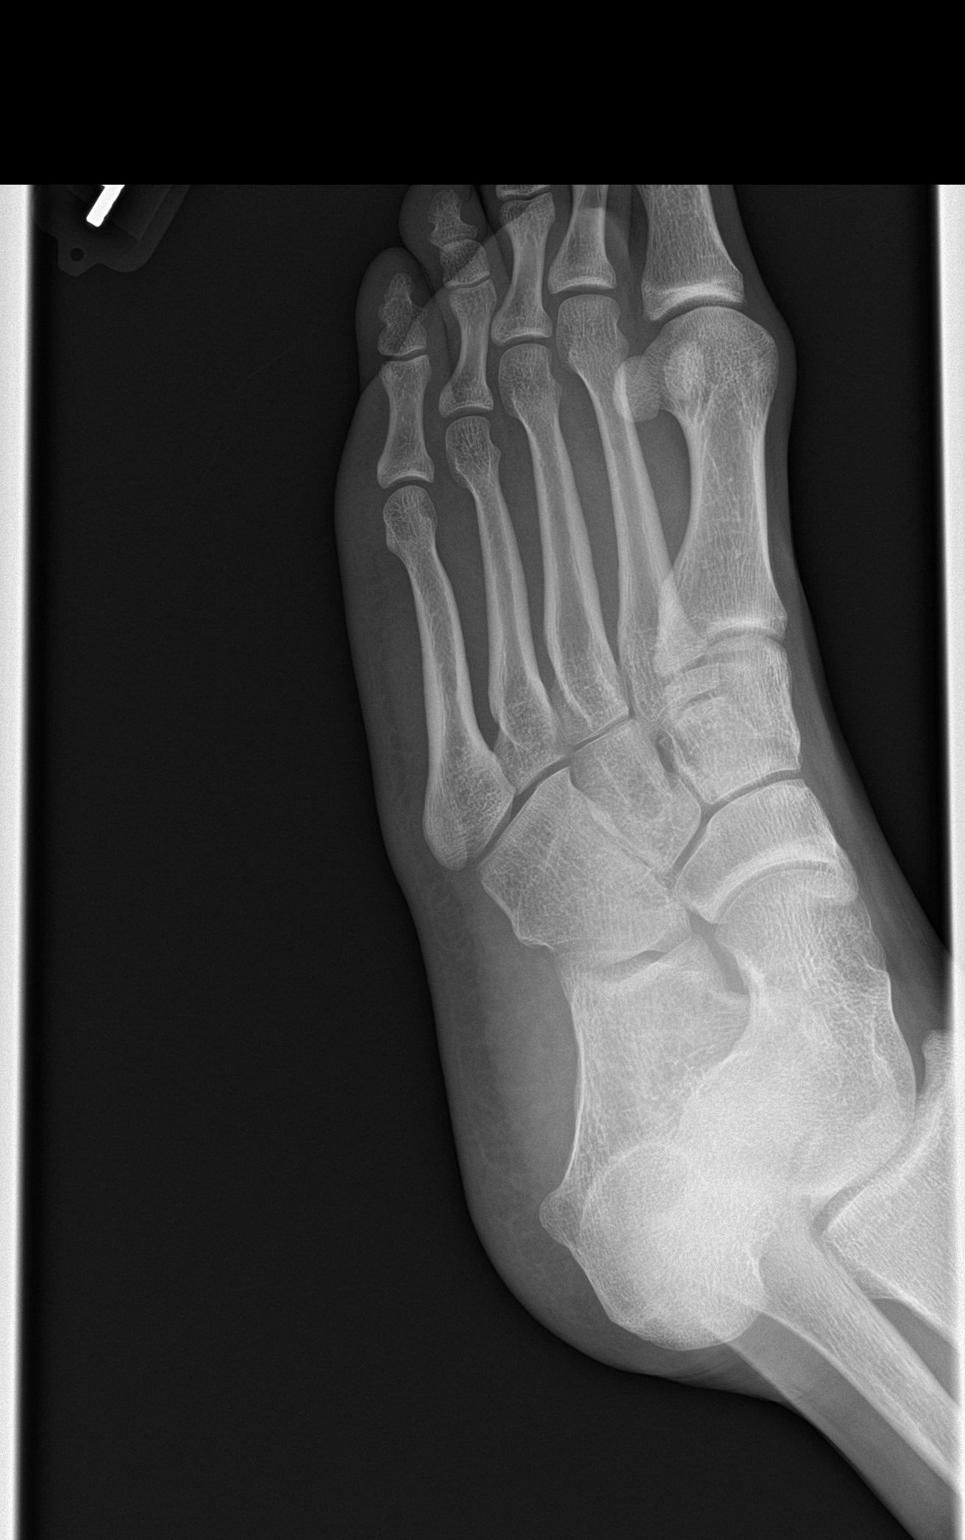

[foot lat]
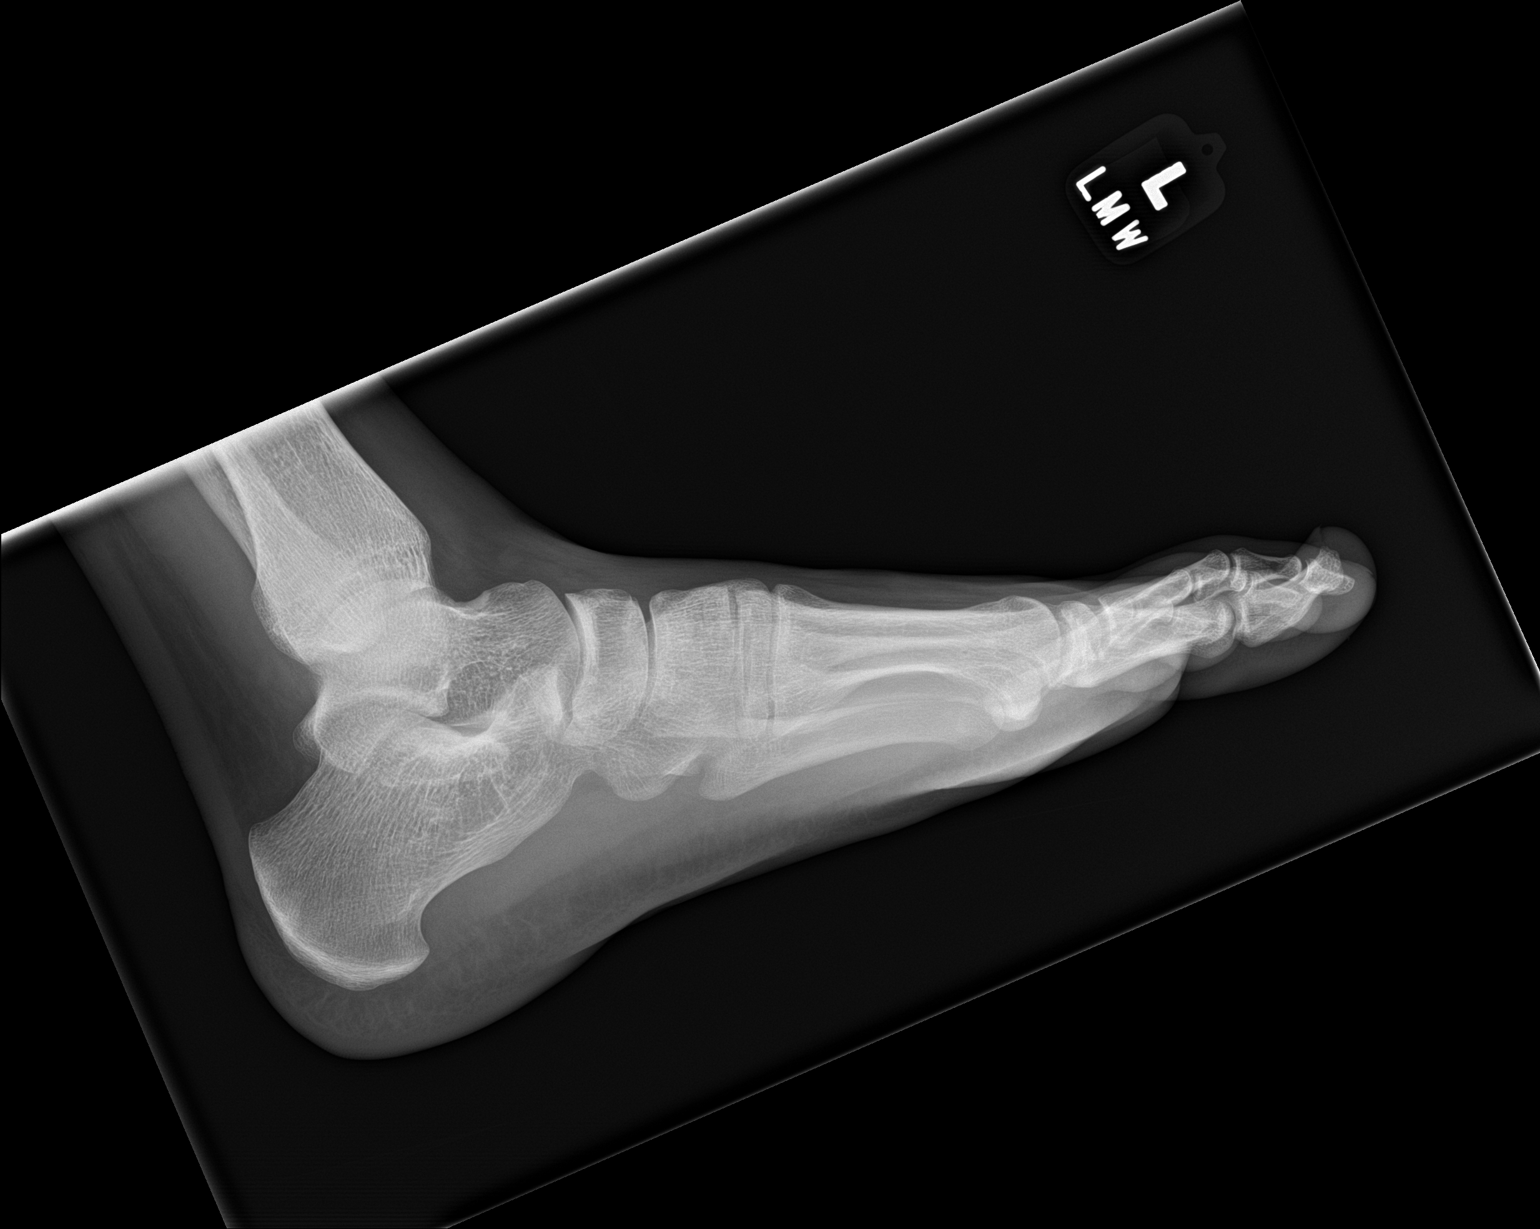

[3 of 3 positions shown; findings below may reference images not displayed]

FINDINGS: There is no evidence of fracture or dislocation. There is no
evidence of arthropathy or other focal bone abnormality. Soft
tissues are unremarkable.
IMPRESSION: Negative.

## 2021-07-05 ENCOUNTER — Other Ambulatory Visit: Payer: Self-pay

## 2021-07-05 ENCOUNTER — Ambulatory Visit
Admission: EM | Admit: 2021-07-05 | Discharge: 2021-07-05 | Disposition: A | Discharge status: Home / Self Care | Payer: Medicaid Other | Attending: Family | Admitting: Family

## 2021-07-05 DIAGNOSIS — M546 Pain in thoracic spine: Secondary | ICD-10-CM | POA: Diagnosis not present

## 2021-07-05 DIAGNOSIS — M62830 Muscle spasm of back: Secondary | ICD-10-CM

## 2021-07-05 DIAGNOSIS — Z91030 Bee allergy status: Secondary | ICD-10-CM

## 2021-07-05 DIAGNOSIS — M4124 Other idiopathic scoliosis, thoracic region: Secondary | ICD-10-CM

## 2021-07-05 HISTORY — DX: Bee allergy status: Z91.030

## 2021-07-05 MED ORDER — DICLOFENAC SODIUM 75 MG PO TBEC: 75.0000 mg | DELAYED_RELEASE_TABLET | Freq: Two times a day (BID) | ORAL | 0 refills | Status: AC | PRN

## 2021-07-05 MED ORDER — TIZANIDINE HCL 4 MG PO TABS: 4.0000 mg | ORAL_TABLET | Freq: Three times a day (TID) | ORAL | 0 refills | Status: AC | PRN

## 2021-07-05 NOTE — Discharge Instructions (Addendum)
Recommend start Diclofenac 75mg  every 12 hours as needed for pain- your insurance may not cover this but you can get a discount on this prescription through [facility]. May also take Zanaflex 4mg  every 8 hours as needed for muscle spasms. Recommend apply warm compresses to area for comfort. Recommend follow-up with Orthopedic or Spine Specialist for further evaluation.

## 2021-07-05 NOTE — ED Triage Notes (Signed)
Patient is here for "Back pain". Middle, upper. Started "about 3 days ago'. History of scoliosis. Hurts worse with "some activity/work". No injury known.

## 2021-07-05 NOTE — ED Provider Notes (Signed)
[facility]    CSN: [id number] Arrival date & time: 07/05/21  1149      History   Chief Complaint Chief Complaint  Patient presents with   Back Pain    HPI Jeffery [name] is a 21 y.o. male.   21 year old male presents with thoracic and upper back pain, mainly on right side, for the past 3 days. No lower back pain. No distinct injury but friends have noticed a "curve to my spine". No official diagnosis of scoliosis. Has had intermittent thoracic back pain off and on for the past 2 years. Works at [employer] and has noticed increased pain with twisting and picking up items. Yesterday and today was crying due to pain. Has taken Ibuprofen and Tylenol with minimal relief. Was seen at [facility] ER in Nov 2021 for right sided back pain and spasms and was placed on Robaxin. Was not helpful and came to [facility] a few days later and was prescribed Zanaflex and Prednisone dose pack with some relief. Also went to [facility] in Nov 2021 for right shoulder pain and rotator cuff irritation. Was placed on Neurontin which he completed. Has never seen an Orthopedic or Spine Specialist for back pain. Former tobacco smoker. Still vapes. No other chronic health issues. Takes no daily medication.   The history is provided by the patient.   Past Medical History:  Diagnosis Date   Allergy to bee sting    Heart burn     Patient Active Problem List   Diagnosis Date Noted   Allergy to bee sting 07/05/2021    Past Surgical History:  Procedure Laterality Date   NO PAST SURGERIES         Home Medications    Prior to Admission medications   Medication Sig Start Date End Date Taking? Authorizing Provider  diclofenac (VOLTAREN) 75 MG EC tablet Take 1 tablet (75 mg total) by mouth every 12 (twelve) hours as needed for moderate pain. 07/05/21  Yes Kasee Hantz, Nicholes Stairs, NP  tiZANidine (ZANAFLEX) 4 MG tablet Take 1 tablet (4 mg total) by mouth every 8 (eight) hours  as needed for muscle spasms. 07/05/21  Yes Aquita Simmering, Nicholes Stairs, NP  gabapentin (NEURONTIN) 100 MG capsule 100 mg daily as needed. 04/11/20 10/29/20  [provider]    Family History Family History  Problem Relation Age of Onset   Thyroid disease Mother    Healthy Father     Social History Social History   Tobacco Use   Smoking status: Former    Types: Cigarettes    Quit date: 03/14/2020    Years since quitting: 1.3   Smokeless tobacco: Never  Vaping Use   Vaping Use: Every day   Substances: Nicotine, Flavoring  Substance Use Topics   Alcohol use: No   Drug use: No     Allergies   Bee venom   Review of Systems Review of Systems  Constitutional:  Negative for appetite change, chills, diaphoresis, fatigue, fever and unexpected weight change.  Respiratory:  Negative for cough, chest tightness and shortness of breath.   Gastrointestinal:  Negative for nausea and vomiting.  Genitourinary:  Negative for decreased urine volume, difficulty urinating, flank pain and hematuria.  Musculoskeletal:  Positive for arthralgias, back pain, myalgias and neck pain. Negative for gait problem and neck stiffness.  Skin:  Negative for color change and rash.  Allergic/Immunologic: Positive for environmental allergies. Negative for food allergies and immunocompromised state.  Neurological:  Negative for dizziness, tremors, seizures, syncope, weakness, light-headedness, numbness and headaches.  Hematological:  Negative for adenopathy. Does not bruise/bleed easily.    Physical Exam Triage Vital Signs ED Triage Vitals  Enc Vitals Group     BP 07/05/21 1155 140/72     Pulse Rate 07/05/21 1155 88     Resp 07/05/21 1155 18     Temp 07/05/21 1155 98.3 F (36.8 C)     Temp Source 07/05/21 1155 Oral     SpO2 07/05/21 1155 100 %     Weight 07/05/21 1157 130 lb (59 kg)     Height 07/05/21 1157 6' (1.829 m)     Head Circumference --      Peak Flow --      Pain Score 07/05/21 1155 9      Pain Loc --      Pain Edu? --      Excl. in [place]? --    No data found.  Updated Vital Signs BP 140/72 (BP Location: Left Arm)    Pulse 88    Temp 98.3 F (36.8 C) (Oral)    Resp 18    Ht 6' (1.829 m)    Wt 130 lb (59 kg)    SpO2 100%    BMI 17.63 kg/m   Visual Acuity Right Eye Distance:   Left Eye Distance:   Bilateral Distance:    Right Eye Near:   Left Eye Near:    Bilateral Near:     Physical Exam Vitals and nursing note reviewed.  Constitutional:      General: He is awake. He is not in acute distress.    Appearance: He is well-developed, well-groomed and underweight.     Comments: He is sitting in the exam chair in no acute distress but appears uncomfortable with changing positions and rotating his neck.   HENT:     Head: Normocephalic and atraumatic.     Jaw: There is normal jaw occlusion.     Right Ear: Hearing and external ear normal.     Left Ear: Hearing and external ear normal.  Eyes:     Extraocular Movements: Extraocular movements intact.     Conjunctiva/sclera: Conjunctivae normal.  Neck:      Comments: Has decreased range of motion of his neck, particularly with rotation. Slightly tender along right upper trapezius muscle group. No rash or redness. No neuro deficits noted.  Cardiovascular:     Rate and Rhythm: Normal rate and regular rhythm.     Heart sounds: Normal heart sounds. No murmur heard. Pulmonary:     Effort: Pulmonary effort is normal. No respiratory distress.     Breath sounds: Normal breath sounds and air entry. No decreased air movement. No decreased breath sounds, wheezing, rhonchi or rales.  Musculoskeletal:        General: Tenderness present. No swelling.     Cervical back: Neck supple. No swelling, edema, deformity, erythema, rigidity or crepitus. Pain with movement and muscular tenderness present. Decreased range of motion.     Thoracic back: Spasms and tenderness present. No swelling. Decreased range of motion. Scoliosis present.      Lumbar back: Normal.       Back:     Comments: Decreased range of motion of mid-back, especially with rotation. Pain with most movements. Tender along upper trapezius and mid thoracic muscle groups. Small curvature of the spin in mid-thoracic region to the right with lower thoracic and upper lumbar curvature to the left. No  neuro deficits noted.   Skin:    General: Skin is warm and dry.     Findings: No bruising, ecchymosis, erythema or rash.  Neurological:     General: No focal deficit present.     Mental Status: He is alert and oriented to person, place, and time.     Sensory: Sensation is intact. No sensory deficit.     Motor: Motor function is intact.     Gait: Gait is intact.     Deep Tendon Reflexes: Reflexes are normal and symmetric.  Psychiatric:        Mood and Affect: Mood normal.        Behavior: Behavior normal. Behavior is cooperative.        Thought Content: Thought content normal.        Judgment: Judgment normal.     UC Treatments / Results  Labs (all labs ordered are listed, but only abnormal results are displayed) Labs Reviewed - No data to display  EKG   Radiology No results found.  Procedures Procedures (including critical care time)  Medications Ordered in UC Medications - No data to display  Initial Impression / Assessment and Plan / UC Course  I have reviewed the triage vital signs and the nursing notes.  Pertinent labs & imaging results that were available during my care of the patient were reviewed by me and considered in my medical decision making (see chart for details).     Reviewed with patient that he appears to have a thoracic and trapezius muscle strain. Recommend trial anti-inflammatory medication and muscle relaxer. May start Voltaren 75mg  every 12 hours as needed (reviewed that Medicaid often does not cover these medications but he can use [facility] to reduce cost). May also retry Zanaflex 4mg  every 8 hours as needed for muscle  spasms/pain. Apply warm compresses to area for comfort. Discussed that he needs further evaluation by an Orthopedic or Spine specialist for official diagnosis of scoliosis and management. Recommend call [facility] first since he has seen them in the past but also provided Spine and Scoliosis Specialist information in [place] for patient to contact. Note written for work. Follow-up with Specialist as recommended.   Final Clinical Impressions(s) / UC Diagnoses   Final diagnoses:  Acute right-sided thoracic back pain  Other idiopathic scoliosis, thoracic region  Muscle spasm of back     Discharge Instructions      Recommend start Diclofenac 75mg  every 12 hours as needed for pain- your insurance may not cover this but you can get a discount on this prescription through [facility]. May also take Zanaflex 4mg  every 8 hours as needed for muscle spasms. Recommend apply warm compresses to area for comfort. Recommend follow-up with Orthopedic or Spine Specialist for further evaluation.     ED Prescriptions     Medication Sig Dispense Auth. Provider   tiZANidine (ZANAFLEX) 4 MG tablet Take 1 tablet (4 mg total) by mouth every 8 (eight) hours as needed for muscle spasms. 21 tablet Katy Apo, NP   diclofenac (VOLTAREN) 75 MG EC tablet Take 1 tablet (75 mg total) by mouth every 12 (twelve) hours as needed for moderate pain. 30 tablet Arletha Marschke, Nicholes Stairs, NP      PDMP not reviewed this encounter.   Katy Apo, NP 07/06/21 1200

## 2021-09-30 ENCOUNTER — Emergency Department: Payer: Medicaid Other

## 2021-09-30 DIAGNOSIS — R0602 Shortness of breath: Secondary | ICD-10-CM | POA: Insufficient documentation

## 2021-09-30 DIAGNOSIS — R064 Hyperventilation: Secondary | ICD-10-CM | POA: Diagnosis not present

## 2021-09-30 DIAGNOSIS — Z5321 Procedure and treatment not carried out due to patient leaving prior to being seen by health care provider: Secondary | ICD-10-CM | POA: Diagnosis not present

## 2021-09-30 LAB — CBC
HCT: 40 % (ref 39.0–52.0)
Hemoglobin: 13.3 g/dL (ref 13.0–17.0)
MCH: 28.4 pg (ref 26.0–34.0)
MCHC: 33.3 g/dL (ref 30.0–36.0)
MCV: 85.3 fL (ref 80.0–100.0)
Platelets: 267 10*3/uL (ref 150–400)
RBC: 4.69 MIL/uL (ref 4.22–5.81)
RDW: 12.4 % (ref 11.5–15.5)
WBC: 7.7 10*3/uL (ref 4.0–10.5)
nRBC: 0 % (ref 0.0–0.2)

## 2021-09-30 LAB — TROPONIN I (HIGH SENSITIVITY): Troponin I (High Sensitivity): 6 ng/L (ref <18)

## 2021-09-30 NOTE — ED Triage Notes (Signed)
Pt presents via POV c/o SOB. Pt tearful in triage and hyperventilating. Reports had similar episode in past with dx of anxiety. Reports SOB with inspiration.  ?

## 2021-09-30 NOTE — ED Notes (Signed)
Per lab BMP unable to be run due to hemolyzed blood tubing ([place]) ?

## 2021-10-01 ENCOUNTER — Emergency Department
Admission: EM | Admit: 2021-10-01 | Discharge: 2021-10-01 | Discharge status: Against Medical Advice | Payer: Medicaid Other | Attending: Emergency Medicine | Admitting: Emergency Medicine

## 2021-10-01 NOTE — ED Notes (Signed)
No answer when called several times from lobby 

## 2022-06-20 IMAGING — CR DG CHEST 2V
1 series · 2 of 2 positions shown · non-contrast
Comparison: None Available.

CLINICAL DATA: Chest pain, shortness of breath

EXAM:
CHEST - 2 VIEW

[Series 1: dg chest 2 view · 0.14mm/px · 2 of 2 slices shown]
[im 1/2]
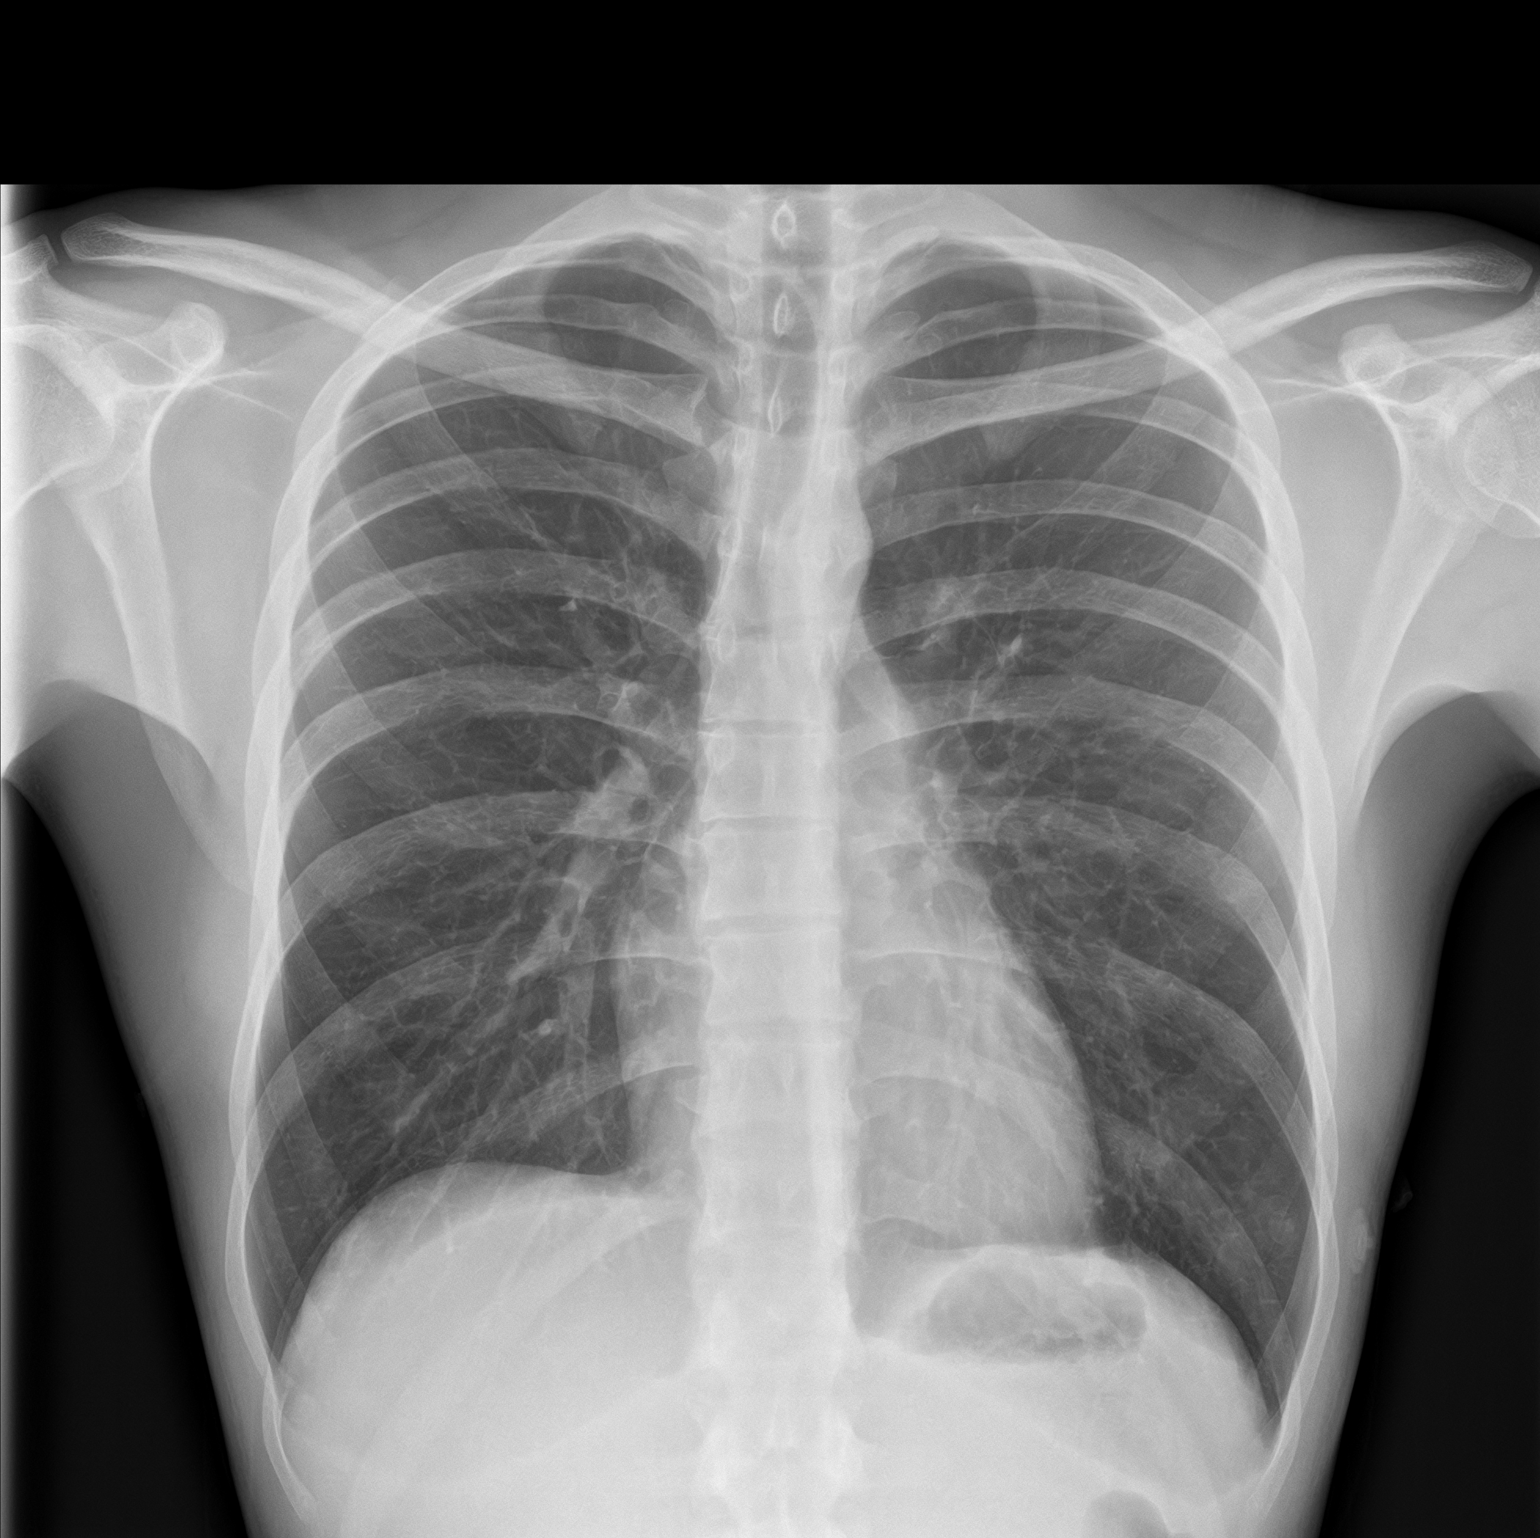
[im 2/2]
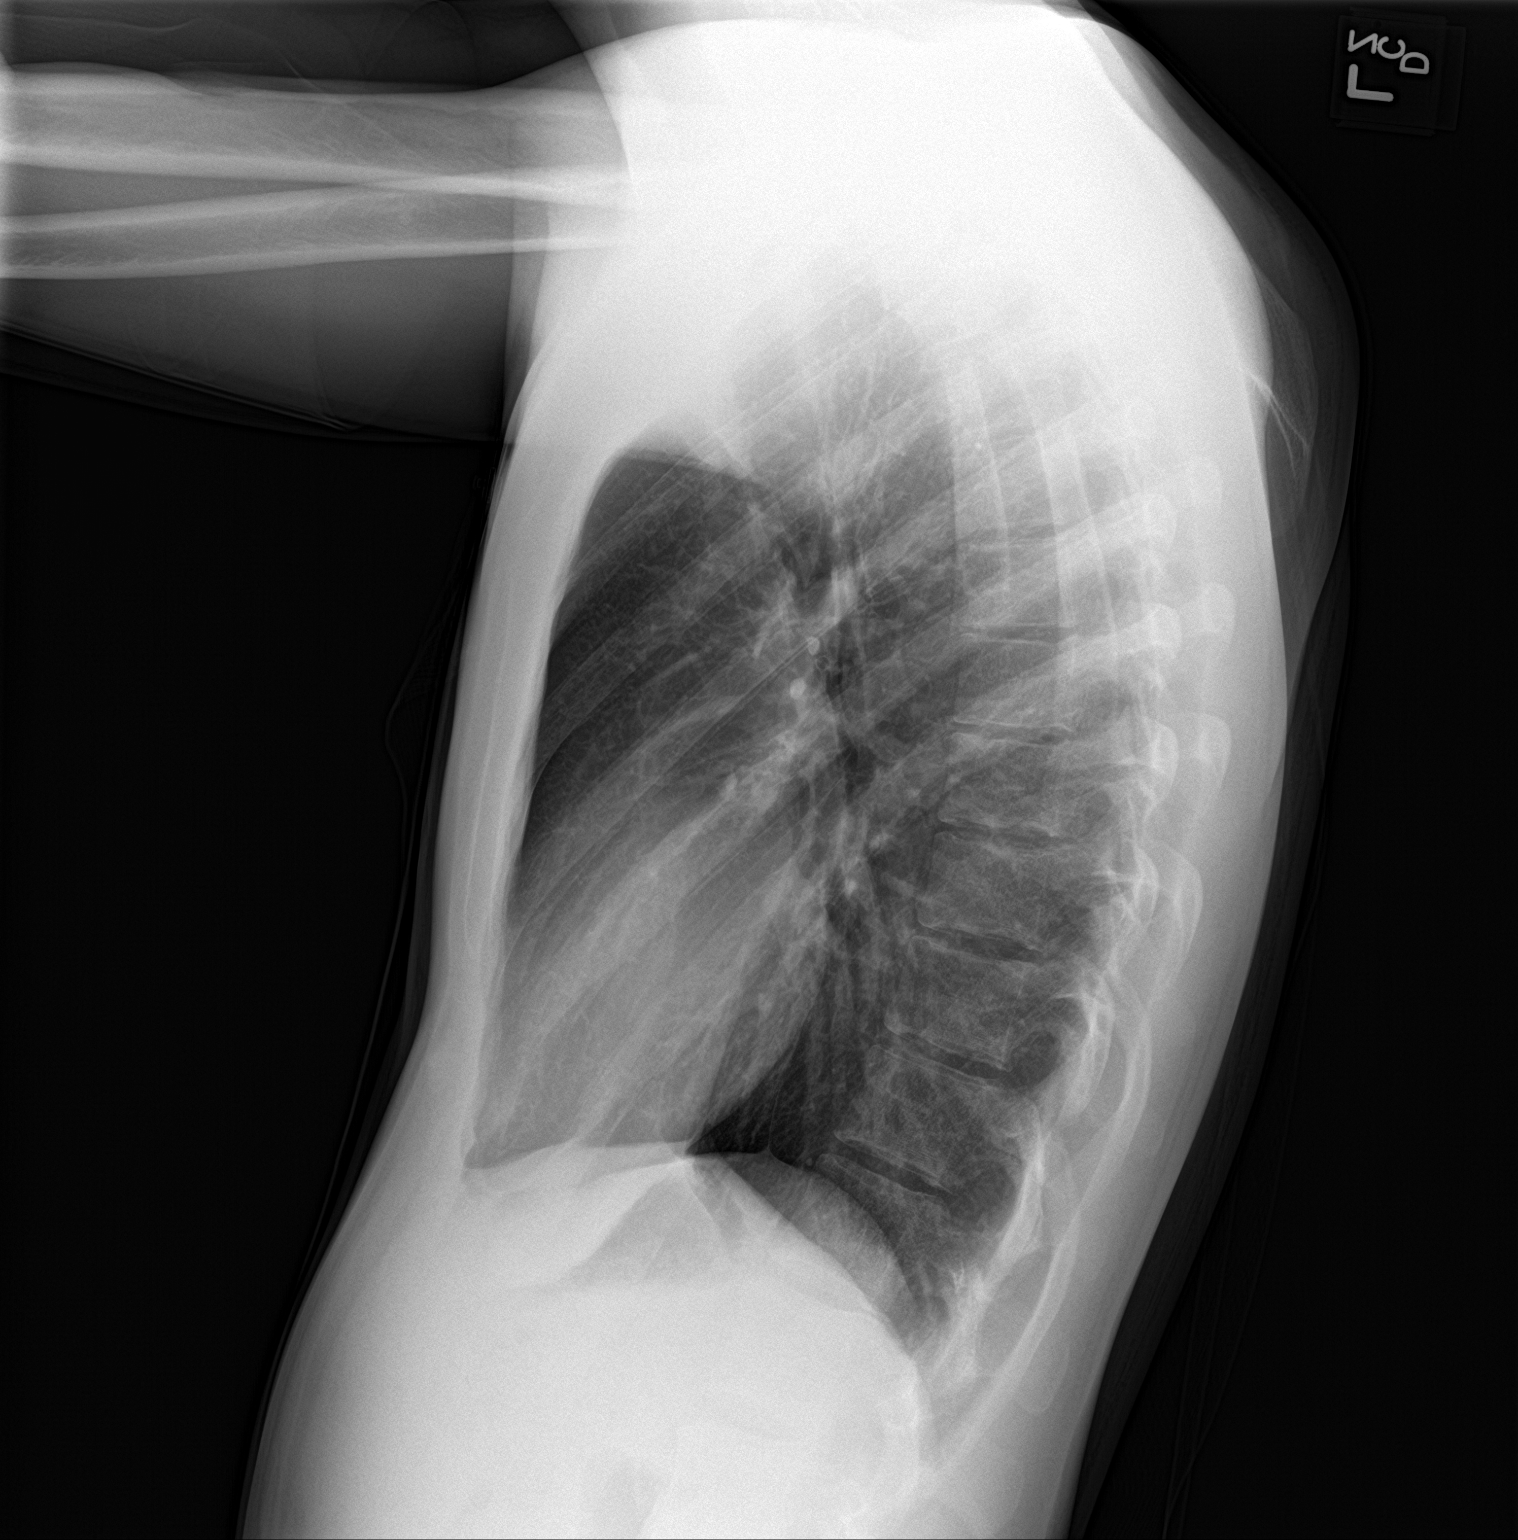

[2 of 2 positions shown; findings below may reference images not displayed]

FINDINGS: The heart size and mediastinal contours are within normal limits.
Both lungs are clear. The visualized skeletal structures are
unremarkable.
IMPRESSION: Normal study.

## 2023-03-09 ENCOUNTER — Ambulatory Visit (INDEPENDENT_AMBULATORY_CARE_PROVIDER_SITE_OTHER): Payer: Self-pay

## 2023-03-09 ENCOUNTER — Ambulatory Visit
Admission: EM | Admit: 2023-03-09 | Discharge: 2023-03-09 | Disposition: A | Discharge status: Home / Self Care | Payer: Medicaid Other | Attending: Internal Medicine | Admitting: Internal Medicine

## 2023-03-09 DIAGNOSIS — M79641 Pain in right hand: Secondary | ICD-10-CM

## 2023-03-09 DIAGNOSIS — S62324A Displaced fracture of shaft of fourth metacarpal bone, right hand, initial encounter for closed fracture: Secondary | ICD-10-CM

## 2023-03-09 NOTE — ED Triage Notes (Signed)
Patient states that he fell into a fence yesterday playing basket ball and injured his right hand. No ibu or tylenol

## 2023-03-09 NOTE — Discharge Instructions (Addendum)
Please keep your right hand in the cast until you are seen by orthopedics.  He may follow-up either with [facility] in [place] or [facility] here in [place]. elevate and ice the area as needed.  Over-the-counter Tylenol ibuprofen as needed for pain.  Please go to the ER if you develop any worsening symptoms prior to seeing orthopedics.  This includes but is not limited to uncontrolled pain or swelling, persistent numbness or tingling, or any new concerns that arise.  Follow-up with your PCP 1 week for recheck.  I hope you feel better soon!

## 2023-03-09 NOTE — ED Provider Notes (Signed)
[facility]    CSN: [id number] Arrival date & time: 03/09/23  1344      History   Chief Complaint Chief Complaint  Patient presents with   Hand Injury    HPI Jeffery [name] is a 22 y.o. male presents for hand pain.  Patient states yesterday while playing basketball he tripped over someone's foot causing him to fall backwards and grabbing onto the fence with his right hand.  States he felt a pop and has since had swelling to the dorsum of the right hand and pain primarily over the fourth metacarpal.  No numbness or tingling.  No wrist pain.  No history of injuries or surgeries to the hand in the past.  He has not been treating his symptoms with any OTC medications.  No other injuries or concerns at this time.   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 Authorizing Provider  diclofenac (VOLTAREN) 75 MG EC tablet Take 1 tablet (75 mg total) by mouth every 12 (twelve) hours as needed for moderate pain. 07/05/21   Sudie Grumbling, NP  tiZANidine (ZANAFLEX) 4 MG tablet Take 1 tablet (4 mg total) by mouth every 8 (eight) hours as needed for muscle spasms. 07/05/21   Sudie Grumbling, NP  gabapentin (NEURONTIN) 100 MG capsule 100 mg daily as needed. 04/11/20 10/29/20  [provider]    Family History Family History  Problem Relation Age of Onset   Thyroid disease Mother    Healthy Father     Social History Social History   Tobacco Use   Smoking status: Former    Current packs/day: 0.00    Types: Cigarettes    Quit date: 03/14/2020    Years since quitting: 2.9   Smokeless tobacco: Never  Vaping Use   Vaping status: Every Day    Substances: Nicotine, Flavoring  Substance Use Topics   Alcohol use: No   Drug use: No     Allergies   Bee venom   Review of Systems Review of Systems  Musculoskeletal:        Right hand pain      Physical Exam Triage Vital Signs ED Triage Vitals  Encounter Vitals Group     BP 03/09/23 1416 (!) 147/79     Systolic BP Percentile --      Diastolic BP Percentile --      Pulse Rate 03/09/23 1416 76     Resp 03/09/23 1416 17     Temp 03/09/23 1416 98.5 F (36.9 C)     Temp Source 03/09/23 1416 Oral     SpO2 03/09/23 1416 97 %     Weight --      Height --      Head Circumference --      Peak Flow --      Pain Score 03/09/23 1415 5     Pain Loc --      Pain Education --      Exclude from Growth Chart --    No data found.  Updated Vital Signs BP Marland Kitchen)  147/79 (BP Location: Right Arm)   Pulse 76   Temp 98.5 F (36.9 C) (Oral)   Resp 17   SpO2 97%   Visual Acuity Right Eye Distance:   Left Eye Distance:   Bilateral Distance:    Right Eye Near:   Left Eye Near:    Bilateral Near:     Physical Exam Vitals and nursing note reviewed.  Constitutional:      General: He is not in acute distress.    Appearance: Normal appearance. He is not ill-appearing.  HENT:     Head: Normocephalic and atraumatic.  Eyes:     Pupils: Pupils are equal, round, and reactive to light.  Cardiovascular:     Rate and Rhythm: Normal rate.  Pulmonary:     Effort: Pulmonary effort is normal.  Musculoskeletal:       Hands:     Comments: Swelling without ecchymosis to the dorsum of the right hand.  Tender to palpation over the third and fourth MCP joints that extends to mid metacarpals.  No tenderness to fingers.  No snuffbox tenderness or tenderness palpation to wrist.  Full range of motion of hand with pain on active flexion and extension of the fingers.  Cap refill +2 in all digits.  Skin:    General: Skin is warm and dry.  Neurological:     General: No focal deficit present.      Mental Status: He is alert and oriented to person, place, and time.  Psychiatric:        Mood and Affect: Mood normal.        Behavior: Behavior normal.      UC Treatments / Results  Labs (all labs ordered are listed, but only abnormal results are displayed) Labs Reviewed - No data to display  EKG   Radiology No results found.  Procedures Procedures (including critical care time)  Medications Ordered in UC Medications - No data to display  Initial Impression / Assessment and Plan / UC Course  I have reviewed the triage vital signs and the nursing notes.  Pertinent labs & imaging results that were available during my care of the patient were reviewed by me and considered in my medical decision making (see chart for details).     Reviewed exam and symptoms with patient.  Wet read of x-ray shows fourth metacarpal fracture.  Discussed with patient.  Placed in fiberglass ulnar gutter splint and nursing staff.  Sensation and circulation intact after placement.  will refer to orthopedics.  RICE therapy.  Patient declined Rx Norco and will use over-the-counter analgesics as needed.  Advised PCP follow-up 1 week for recheck.  Strict ER precautions reviewed and patient verbalized understanding. Final Clinical Impressions(s) / UC Diagnoses   Final diagnoses:  Right hand pain  Closed displaced fracture of shaft of fourth metacarpal bone of right hand, initial encounter     Discharge Instructions      Please keep your right hand in the cast until you are seen by orthopedics.  He may follow-up either with [facility] in [place] or [facility] here in [place]. elevate and ice the area as needed.  Over-the-counter Tylenol ibuprofen as needed for pain.  Please go to the ER if you develop any worsening symptoms prior to seeing orthopedics.  This includes but is not limited to uncontrolled pain or swelling, persistent numbness or tingling, or any new concerns that arise.   Follow-up with your PCP 1 week for recheck.  I hope  you feel better soon!     ED Prescriptions   None    PDMP not reviewed this encounter.   Radford Pax, NP 03/09/23 [phone]
